# Patient Record
Sex: Female | Born: 1950 | Race: White
Health system: Southern US, Community
[De-identification: ages and names within clinical notes are randomized; demographics above are authoritative.]

## PROBLEM LIST (undated history)

## (undated) DIAGNOSIS — E039 Hypothyroidism, unspecified: Secondary | ICD-10-CM

## (undated) DIAGNOSIS — G2581 Restless legs syndrome: Secondary | ICD-10-CM

## (undated) DIAGNOSIS — E785 Hyperlipidemia, unspecified: Secondary | ICD-10-CM

## (undated) DIAGNOSIS — R55 Syncope and collapse: Secondary | ICD-10-CM

## (undated) DIAGNOSIS — I609 Nontraumatic subarachnoid hemorrhage, unspecified: Secondary | ICD-10-CM

## (undated) DIAGNOSIS — T884XXA Failed or difficult intubation, initial encounter: Secondary | ICD-10-CM

## (undated) DIAGNOSIS — K219 Gastro-esophageal reflux disease without esophagitis: Secondary | ICD-10-CM

## (undated) DIAGNOSIS — Z0000 Encounter for general adult medical examination without abnormal findings: Secondary | ICD-10-CM

## (undated) DIAGNOSIS — F32A Depression, unspecified: Secondary | ICD-10-CM

## (undated) DIAGNOSIS — F419 Anxiety disorder, unspecified: Secondary | ICD-10-CM

## (undated) DIAGNOSIS — J349 Unspecified disorder of nose and nasal sinuses: Secondary | ICD-10-CM

## (undated) DIAGNOSIS — R1310 Dysphagia, unspecified: Secondary | ICD-10-CM

## (undated) DIAGNOSIS — L309 Dermatitis, unspecified: Secondary | ICD-10-CM

## (undated) DIAGNOSIS — K5792 Diverticulitis of intestine, part unspecified, without perforation or abscess without bleeding: Secondary | ICD-10-CM

## (undated) DIAGNOSIS — R262 Difficulty in walking, not elsewhere classified: Secondary | ICD-10-CM

## (undated) DIAGNOSIS — E079 Disorder of thyroid, unspecified: Secondary | ICD-10-CM

## (undated) DIAGNOSIS — M1990 Unspecified osteoarthritis, unspecified site: Secondary | ICD-10-CM

## (undated) DIAGNOSIS — I10 Essential (primary) hypertension: Secondary | ICD-10-CM

## (undated) DIAGNOSIS — H269 Unspecified cataract: Secondary | ICD-10-CM

## (undated) DIAGNOSIS — N390 Urinary tract infection, site not specified: Secondary | ICD-10-CM

## (undated) DIAGNOSIS — J45909 Unspecified asthma, uncomplicated: Secondary | ICD-10-CM

## (undated) DIAGNOSIS — R0609 Other forms of dyspnea: Secondary | ICD-10-CM

## (undated) HISTORY — DX: Syncope and collapse: R55

## (undated) HISTORY — DX: Essential (primary) hypertension: I10

## (undated) HISTORY — DX: Unspecified cataract: H26.9

## (undated) HISTORY — DX: Other forms of dyspnea: R06.09

## (undated) HISTORY — PX: HYSTERECTOMY: SHX81

## (undated) HISTORY — DX: Gastro-esophageal reflux disease without esophagitis: K21.9

## (undated) HISTORY — PX: NASAL SEPTUM SURGERY: SHX37

## (undated) HISTORY — DX: Failed or difficult intubation, initial encounter: T88.4XXA

## (undated) HISTORY — DX: Nontraumatic subarachnoid hemorrhage, unspecified: I60.9

## (undated) HISTORY — DX: Encounter for general adult medical examination without abnormal findings: Z00.00

## (undated) HISTORY — DX: Depression, unspecified: F32.A

## (undated) HISTORY — DX: Unspecified osteoarthritis, unspecified site: M19.90

## (undated) HISTORY — DX: Unspecified disorder of nose and nasal sinuses: J34.9

## (undated) HISTORY — DX: Dysphagia, unspecified: R13.10

## (undated) HISTORY — DX: Unspecified asthma, uncomplicated: J45.909

## (undated) HISTORY — DX: Difficulty in walking, not elsewhere classified: R26.2

## (undated) HISTORY — DX: Hypothyroidism, unspecified: E03.9

## (undated) HISTORY — DX: Restless legs syndrome: G25.81

## (undated) HISTORY — DX: Disorder of thyroid, unspecified: E07.9

## (undated) HISTORY — DX: Diverticulitis of intestine, part unspecified, without perforation or abscess without bleeding: K57.92

## (undated) HISTORY — PX: COLONOSCOPY: SHX174

## (undated) HISTORY — DX: Hyperlipidemia, unspecified: E78.5

## (undated) HISTORY — DX: Anxiety disorder, unspecified: F41.9

## (undated) HISTORY — DX: Dermatitis, unspecified: L30.9

## (undated) HISTORY — DX: Urinary tract infection, site not specified: N39.0

---

## 1996-04-24 ENCOUNTER — Ambulatory Visit: Admit: 1996-04-24 | Disposition: A | Discharge status: Home / Self Care | Payer: Self-pay | Source: Ambulatory Visit

## 2003-02-19 ENCOUNTER — Ambulatory Visit
Admit: 2003-02-19 | Disposition: A | Discharge status: Home / Self Care | Payer: Self-pay | Source: Ambulatory Visit | Admitting: Gynecology

## 2004-03-21 ENCOUNTER — Ambulatory Visit
Admit: 2004-03-21 | Disposition: A | Discharge status: Home / Self Care | Payer: Self-pay | Source: Ambulatory Visit | Admitting: Gynecology

## 2005-02-20 DIAGNOSIS — Z9889 Other specified postprocedural states: Secondary | ICD-10-CM

## 2005-02-20 HISTORY — DX: Other specified postprocedural states: Z98.890

## 2005-10-30 ENCOUNTER — Ambulatory Visit: Admission: RE | Admit: 2005-10-30 | Payer: Self-pay | Source: Ambulatory Visit | Admitting: Otolaryngology

## 2009-03-19 ENCOUNTER — Encounter (INDEPENDENT_AMBULATORY_CARE_PROVIDER_SITE_OTHER): Payer: Self-pay | Admitting: Internal Medicine

## 2010-02-20 DIAGNOSIS — Z0000 Encounter for general adult medical examination without abnormal findings: Secondary | ICD-10-CM

## 2010-02-20 HISTORY — DX: Encounter for general adult medical examination without abnormal findings: Z00.00

## 2010-08-31 ENCOUNTER — Emergency Department
Admit: 2010-08-31 | Disposition: A | Discharge status: Home / Self Care | Payer: Self-pay | Source: Emergency Department | Admitting: Emergency Medicine

## 2010-08-31 LAB — COMPREHENSIVE METABOLIC PANEL
ALT: 21 U/L (ref 0–55)
AST (SGOT): 21 U/L (ref 5–34)
Albumin/Globulin Ratio: 1.1 (ref 0.9–2.2)
Albumin: 3.5 g/dL (ref 3.5–5.0)
Alkaline Phosphatase: 39 U/L — ABNORMAL LOW (ref 40–150)
Anion Gap: 12 (ref 5.0–15.0)
BUN: 33 mg/dL — ABNORMAL HIGH (ref 7.0–19.0)
Bilirubin, Total: 0.3 mg/dL (ref 0.2–1.2)
CO2: 19 mEq/L — ABNORMAL LOW (ref 22–29)
Calcium: 9.8 mg/dL (ref 8.5–10.5)
Chloride: 109 mEq/L — ABNORMAL HIGH (ref 98–107)
Creatinine: 1.2 mg/dL — ABNORMAL HIGH (ref 0.6–1.0)
Globulin: 3.2 g/dL (ref 2.0–3.6)
Glucose: 106 mg/dL — ABNORMAL HIGH (ref 70–100)
Potassium: 3.9 mEq/L (ref 3.5–5.1)
Protein, Total: 6.7 g/dL (ref 6.0–8.3)
Sodium: 140 mEq/L (ref 136–145)

## 2010-08-31 LAB — URINALYSIS, REFLEX TO MICROSCOPIC EXAM IF INDICATED
Bilirubin, UA: NEGATIVE
Glucose, UA: NEGATIVE
Ketones UA: NEGATIVE
Nitrite, UA: NEGATIVE
Protein, UR: NEGATIVE
RBC, UA: 1 /HPF (ref 0–5)
Specific Gravity UA POCT: 1.01 (ref 1.001–1.035)
Urine pH: 6 (ref 5.0–8.0)
Urobilinogen, UA: NORMAL mg/dL

## 2010-08-31 LAB — CBC AND DIFFERENTIAL
Basophils Absolute Automated: 0.02 10*3/uL (ref 0.00–0.20)
Basophils Automated: 0 % (ref 0–2)
Eosinophils Absolute Automated: 0.45 10*3/uL (ref 0.00–0.70)
Eosinophils Automated: 6 % — ABNORMAL HIGH (ref 0–5)
Hematocrit: 37.9 % (ref 37.0–47.0)
Hgb: 12.8 g/dL (ref 12.0–16.0)
Immature Granulocytes Absolute: 0.01 10*3/uL
Immature Granulocytes: 0 % (ref 0–1)
Lymphocytes Absolute Automated: 2.12 10*3/uL (ref 0.50–4.40)
Lymphocytes Automated: 31 % (ref 15–41)
MCH: 28.4 pg (ref 28.0–32.0)
MCHC: 33.8 g/dL (ref 32.0–36.0)
MCV: 84 fL (ref 80.0–100.0)
MPV: 9.4 fL (ref 9.4–12.3)
Monocytes Absolute Automated: 0.51 10*3/uL (ref 0.00–1.20)
Monocytes: 7 % (ref 0–11)
Neutrophils Absolute: 3.83 10*3/uL (ref 1.80–8.10)
Neutrophils: 55 % (ref 52–75)
Nucleated RBC: 0 /100 WBC
Platelets: 302 10*3/uL (ref 140–400)
RBC: 4.51 10*6/uL (ref 4.20–5.40)
RDW: 14 % (ref 12–15)
WBC: 6.93 10*3/uL (ref 3.50–10.80)

## 2010-08-31 LAB — GFR: EGFR: 45.8

## 2010-08-31 LAB — HEMOLYSIS INDEX: Hemolysis Index: 8 Index (ref 0–18)

## 2010-08-31 LAB — LIPASE: Lipase: 89 U/L — ABNORMAL HIGH (ref 8–78)

## 2010-09-16 ENCOUNTER — Ambulatory Visit (INDEPENDENT_AMBULATORY_CARE_PROVIDER_SITE_OTHER): Payer: BLUE CROSS/BLUE SHIELD | Admitting: Internal Medicine

## 2010-09-16 ENCOUNTER — Encounter (INDEPENDENT_AMBULATORY_CARE_PROVIDER_SITE_OTHER): Payer: Self-pay | Admitting: Internal Medicine

## 2010-09-16 DIAGNOSIS — E782 Mixed hyperlipidemia: Secondary | ICD-10-CM

## 2010-09-16 DIAGNOSIS — F32A Depression, unspecified: Secondary | ICD-10-CM | POA: Insufficient documentation

## 2010-09-16 DIAGNOSIS — E785 Hyperlipidemia, unspecified: Secondary | ICD-10-CM | POA: Insufficient documentation

## 2010-09-16 DIAGNOSIS — Z0000 Encounter for general adult medical examination without abnormal findings: Secondary | ICD-10-CM | POA: Insufficient documentation

## 2010-09-16 DIAGNOSIS — R799 Abnormal finding of blood chemistry, unspecified: Secondary | ICD-10-CM

## 2010-09-16 DIAGNOSIS — E079 Disorder of thyroid, unspecified: Secondary | ICD-10-CM | POA: Insufficient documentation

## 2010-09-16 DIAGNOSIS — R109 Unspecified abdominal pain: Secondary | ICD-10-CM

## 2010-09-16 DIAGNOSIS — R7989 Other specified abnormal findings of blood chemistry: Secondary | ICD-10-CM

## 2010-09-16 DIAGNOSIS — B850 Pediculosis due to Pediculus humanus capitis: Secondary | ICD-10-CM

## 2010-09-16 NOTE — Progress Notes (Addendum)
Subjective:       Patient ID: Tracy [name] is a 60 y.o. female.    HPI Comments: 60 yo former pt not seen by me > 2 yrs was in er 7/11 with pain l lateral lowwer abd, In er cbc,ua-nl, bmp nl exc creat 1.2 CT abd/pelvis showed ? Small b/l inguinal  hernias, some scarring R lung, 1 cm fat density adjacent to distal sig,moid of doubtful signif. Says her boweklls and urination nl, no change with flatus,bm, and discomfort is much better and is positional  Says last colonscooy 5 yrs ago dr. Durwin Nora.  alsio says L eye has been red for a month, and she got head lice on trip to [place] 4/12 and still has them-using rid. beytter but still there.    Eye Problem   Pertinent negatives include no fever, nausea or vomiting.     11/01/10 lbas=cmp-nl creat 0.83, alt 41, tsh 5.32  Chol 274 , trigs 225 H53/L176 Notified see me in f/u same meds for now and do cpe recheck tft then she agrees labs faxed dr Neomia Dear   01/05/11 labs-lft-nl, tsh 3.12,Vit D-25 Notified do cpe as told  The following portions of the patient's history were reviewed and updated as appropriate: allergies, current medications, past medical history, past social history, past surgical history and problem list.    Review of Systems   Constitutional: Negative for fever and chills.   Respiratory: Negative for cough.    Gastrointestinal: Negative for nausea, vomiting, diarrhea, constipation, blood in stool, abdominal distention and anal bleeding.   Genitourinary: Negative for dysuria, hematuria and difficulty urinating.           Objective:    Physical Exam   Constitutional: She is oriented to person, place, and time. She appears well-developed and well-nourished. No distress.   Eyes: Left eye exhibits no discharge (l eye diffusely mildly red).   Neck: Normal range of motion. Neck supple. No JVD present. No tracheal deviation present. No thyromegaly present.   Cardiovascular: Normal rate, regular rhythm and normal heart sounds.    Pulmonary/Chest: Effort normal and  breath sounds normal. She has no wheezes. She has no rales.   Abdominal: Soft. Bowel sounds are normal. She exhibits no distension, no abdominal bruit and no mass. There is no hepatosplenomegaly. There is tenderness (minimal tenderness L lateral lower abdomen no masses, guarding). There is no rebound and no guarding.   Lymphadenopathy:     She has no cervical adenopathy.   Neurological: She is alert and oriented to person, place, and time.   Skin: Skin is warm and dry. She is not diaphoretic.   Psychiatric: She has a normal mood and affect.           Assessment:    1probable muscular L lateral abd wall pain started 7/11 much better and ct unrevealing. Advised see her gyn sjhow ct report re b/l ing hernias.  2.creat 1.2  Order given cmp,tsh,lipids next week-i'll call  3,scarring R lung on CT 7/11-never smoked [facility] cx[facility]  4.L eye red for a month-see her ophthalmologist-sje will call dr Karie Chimera today  5.says persistent head lice 3 m-rx for malathion 0.5% lotion-apply dry scalp ,then shampoo 8-12 hrs later  6.get old recordsthen cpe      Plan:    as above

## 2010-10-31 ENCOUNTER — Other Ambulatory Visit (INDEPENDENT_AMBULATORY_CARE_PROVIDER_SITE_OTHER): Payer: Self-pay | Admitting: Internal Medicine

## 2010-11-01 ENCOUNTER — Encounter (INDEPENDENT_AMBULATORY_CARE_PROVIDER_SITE_OTHER): Payer: Self-pay | Admitting: Internal Medicine

## 2010-11-01 LAB — COMPREHENSIVE METABOLIC PANEL
ALT: 41 IU/L — ABNORMAL HIGH (ref 0–40)
AST (SGOT): 26 IU/L (ref 0–40)
Albumin/Globulin Ratio: 1.7 (ref 1.1–2.5)
Albumin: 4.3 g/dL (ref 3.6–4.8)
Alkaline Phosphatase: 55 IU/L (ref 25–165)
BUN / Creatinine Ratio: 18 (ref 11–26)
BUN: 15 mg/dL (ref 8–27)
Bilirubin, Total: 0.3 mg/dL (ref 0.0–1.2)
CO2: 22 mmol/L (ref 20–32)
Calcium: 9.1 mg/dL (ref 8.6–10.2)
Chloride: 104 mmol/L (ref 97–108)
Creatinine: 0.83 mg/dL (ref 0.57–1.00)
EGFR: 77 mL/min/{1.73_m2} (ref >59)
EGFR: 89 mL/min/{1.73_m2} (ref >59)
Globulin, Total: 2.6 g/dL (ref 1.5–4.5)
Glucose: 89 mg/dL (ref 65–99)
Potassium: 4.5 mmol/L (ref 3.5–5.2)
Protein, Total: 6.9 g/dL (ref 6.0–8.5)
Sodium: 140 mmol/L (ref 134–144)

## 2010-11-01 LAB — LIPID PANEL, WITHOUT TOTAL CHOLESTEROL/HDL RATIO, SERUM
Cholesterol: 274 mg/dL — ABNORMAL HIGH (ref 100–199)
HDL: 53 mg/dL (ref >39)
LDL Calculated: 176 mg/dL — ABNORMAL HIGH (ref 0–99)
Triglycerides: 225 mg/dL — ABNORMAL HIGH (ref 0–149)
VLDL Calculated: 45 mg/dL — ABNORMAL HIGH (ref 5–40)

## 2010-11-01 LAB — AMBIG ABBREV LP DEFAULT

## 2010-11-01 LAB — AMBIG ABBREV CMP14 DEFAULT

## 2010-11-01 LAB — TSH: TSH: 5.32 u[IU]/mL — ABNORMAL HIGH (ref 0.450–4.500)

## 2010-11-03 ENCOUNTER — Telehealth (INDEPENDENT_AMBULATORY_CARE_PROVIDER_SITE_OTHER): Payer: Self-pay | Admitting: Internal Medicine

## 2010-11-03 NOTE — Telephone Encounter (Signed)
i called in 10 tabs

## 2010-11-03 NOTE — Telephone Encounter (Signed)
Patient does not have any more pills for her levothyroxine . She has an appoinment scheduled to see you on Monday for her physical she is wondering if you can call in a few pills for her to last until Monday to her [facility] at  [phone]. If you would like to speak with her she can be reached at [phone]

## 2010-11-07 ENCOUNTER — Ambulatory Visit (INDEPENDENT_AMBULATORY_CARE_PROVIDER_SITE_OTHER): Payer: BLUE CROSS/BLUE SHIELD | Admitting: Internal Medicine

## 2010-11-07 ENCOUNTER — Encounter (INDEPENDENT_AMBULATORY_CARE_PROVIDER_SITE_OTHER): Payer: Self-pay | Admitting: Internal Medicine

## 2010-11-07 VITALS — BP 100/67 | HR 66 | Temp 97.8°F | Ht 66.0 in | Wt 150.0 lb

## 2010-11-07 DIAGNOSIS — E039 Hypothyroidism, unspecified: Secondary | ICD-10-CM

## 2010-11-07 DIAGNOSIS — F32A Depression, unspecified: Secondary | ICD-10-CM

## 2010-11-07 DIAGNOSIS — M8580 Other specified disorders of bone density and structure, unspecified site: Secondary | ICD-10-CM

## 2010-11-07 DIAGNOSIS — Z9889 Other specified postprocedural states: Secondary | ICD-10-CM | POA: Insufficient documentation

## 2010-11-07 DIAGNOSIS — Z0000 Encounter for general adult medical examination without abnormal findings: Secondary | ICD-10-CM

## 2010-11-07 DIAGNOSIS — E782 Mixed hyperlipidemia: Secondary | ICD-10-CM

## 2010-11-07 DIAGNOSIS — F3289 Other specified depressive episodes: Secondary | ICD-10-CM

## 2010-11-07 DIAGNOSIS — M949 Disorder of cartilage, unspecified: Secondary | ICD-10-CM

## 2010-11-07 NOTE — Progress Notes (Addendum)
Subjective:       Patient ID: Tracy [name] is a 60 y.o. female.    HPI Comments: to distal sig,moid of doubtful signif. Says her boweklls and urination nl, no change with flatus,bm, and discomfort is much better and is positional  Says last colonscooy 5 yrs ago dr. Durwin Nora.  alsio says L eye has been red for a month, and she got head lice on trip to [place] 4/12 and still has them-using rid. beytter but still there.      Eye Problem    Pertinent negatives include no fever, nausea or vomiting.   11/01/10 lbas=cmp-nl creat 0.83, alt 41, tsh 5.32  Chol 274 , trigs 225 H53/L176 Notified see me in f/u same meds for now and do cpe recheck tft then she agrees labs faxed dr Neomia Dear     11/07/10  60 yo here cpe  Labs as above-says she was out of synthroid for a week before labs drawn.depression-on prozac sees therapist has been stable on this med. 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 Says she has some orthostatic sx wg=hen gets up quickly from leaning over, standing quickly-very brief sx and NO su=yncope. Had myosistis with statins.  06/04/11 labs-cmp-nl creat 0.85, chol 243 trigs 217 H47/L153, ck-205-labs from dr Elisabeth Most ordered 08/11/11 thallium normal-planning abd surgery in [facility]    The following portions of the patient's history were reviewed and updated as appropriate: allergies, current medications, past family history, past medical history, past social history, past surgical history and problem list.    Review of Systems   Constitutional: Negative for fever, chills and unexpected weight change.   Eyes: Negative for visual disturbance.   Respiratory: Negative for cough, shortness of breath and wheezing.    Cardiovascular: Negative for chest pain and palpitations.   Gastrointestinal: Negative for nausea, vomiting, abdominal pain, diarrhea, constipation, blood in stool, abdominal distention, anal bleeding and rectal pain.    Genitourinary: Negative for hematuria, difficulty urinating and pelvic pain.   Musculoskeletal: Negative for myalgias.   Neurological: Positive for light-headedness. Negative for dizziness, seizures, syncope, weakness, numbness and headaches.           Objective:    Physical Exam   Constitutional: She is oriented to person, place, and time. She appears well-developed and well-nourished. No distress.        BP by me left arm 98/66, right 94/66   HENT:   Right Ear: External ear normal.   Left Ear: External ear normal.   Mouth/Throat: Oropharynx is clear and moist. No oropharyngeal exudate.        tms nl   Eyes: EOM are normal. Pupils are equal, round, and reactive to light.   Neck: Normal range of motion. No JVD present. No tracheal deviation present. No thyromegaly present.        Carotids 2+ no bruits   Cardiovascular: Normal rate, regular rhythm, normal heart sounds and intact distal pulses.  Exam reveals no gallop and no friction rub.    No murmur heard.  Pulmonary/Chest: Effort normal and breath sounds normal. No respiratory distress. She has no wheezes. She has no rales.   Abdominal: Soft. Bowel sounds are normal. She exhibits no distension, no abdominal bruit and no mass. There is no hepatosplenomegaly. There is no tenderness. There is no rebound and no guarding.   Musculoskeletal: She exhibits no edema.   Lymphadenopathy:  Head (right side): No submental, no submandibular and no occipital adenopathy present.        Head (left side): No submental, no submandibular and no occipital adenopathy present.     She has no cervical adenopathy.        Right cervical: No superficial cervical, no deep cervical and no posterior cervical adenopathy present.       Left cervical: No superficial cervical, no deep cervical and no posterior cervical adenopathy present.        Right: No supraclavicular adenopathy present.        Left: No supraclavicular adenopathy present.   Neurological: She is alert and oriented to  person, place, and time. She has normal reflexes. No cranial nerve deficit.   Skin: Skin is warm and dry. She is not diaphoretic.   Psychiatric: She has a normal mood and affect.           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  2. Describes sx of simple brief orthostatitc hypotension-NO syncope her BP is low-discuss with dr obre=ien but i explained this is physiologic, get up slowly,keeop hydrated  3. MGM colon cancer 70-I'll check with dr Cecil Cobbs says colonscopy 2007-? Due f/u has noGI sx  4.GYN-sees gun 1/13 pap/mammo-alsosays was getting rclast-dexa with gyn 2011 o'penia-disci=uss ALL this with gyn 1/13  Was on ergocalciferol-check Vit d with labs 11/12  5.hypothyroid-tsh sl elvated was out of med for a week resume synthroid 70mcg/d and recheck TSH,Vit D and LFT 1st week 11/12-has ast 41  6 depression-sees therapist,says stable on propzac 20mg   i rnewd this 90 ref x1  7. Had pneumovax age 33-get #2 age 51. Gets flu shot at work. Had tetanus shot,MMR,hep A and B 3/12 for travel and says she goes back next month for hep bpooosters. I gave order [facility].      Plan:    as above do tsh'lft/Vit D 11/12 i'll call. Note faxed dr Neomia Dear and i'll call dr Durwin Nora re ? Due colonscopy. Same meds for now      Nopte i checked with dr barkin-colonscopy 1/08-is due 5 yr f/iu  1/13 pt notified

## 2010-11-14 ENCOUNTER — Encounter (INDEPENDENT_AMBULATORY_CARE_PROVIDER_SITE_OTHER): Payer: Self-pay

## 2010-11-18 ENCOUNTER — Encounter (INDEPENDENT_AMBULATORY_CARE_PROVIDER_SITE_OTHER): Payer: Self-pay | Admitting: Internal Medicine

## 2010-12-08 NOTE — Op Note (Unsigned)
[name], Tracy [name]      MEDICAL RECORD NUMBER:         [id number]      PATIENT LOCATION/SERVICE:       [id number]            DATE OF SURGERY:               10/30/2005      SURGEON:                       Domenick Gong, MD      ASSISTANT 1:            PREOPERATIVE DIAGNOSES      1.   Deviated nasal septum.      2.   Concha bullosa and nasal polyps.            POSTOPERATIVE DIAGNOSES      1.   Deviated nasal septum.      2.   Concha bullosa and nasal polyps.            PROCEDURES      1.   Septoplasty.      2.   Excision of concha bullosa bilaterally.      3.   Polypectomy.            ANESTHESIA:  General.            DESCRIPTION OF PROCEDURE:  The patient was taken to the operating room and      placed in supine position.  General anesthesia was administered via the      orotracheal tube.  Endotracheal intubation was uneventful.  The face and      nose were prepped with Betadine solution and the patient was draped in the      usual sterile fashion.            Cotton pledgets saturated in cocaine solution were placed in both nasal      passages to achieve topical anesthesia and vasoconstriction.  The inferior      turbinates were very hypertrophic and she had bilateral polypoid concha      bullosa.  Septum was deviated to the right side with a large spur on the      right consisting of both bone and curettage.  Then, nasal hairs were      trimmed and 0.5% lidocaine with 1:90,000 Adrenalin was used to infiltrate      nasal membranes to achieve local anesthesia and vasoconstriction.            Then, the septoplasty was commenced.  Bilateral Killian incisions were      performed and the sharp curved Peck scissors were used to identify the      plane between the mucoperichondrium and septal cartilage and then between      the mucoperiosteum and maxillary crest.  Mucoperichondrial and      mucoperiosteal flaps were elevated using Cottle elevator and they were      joined.  Then, septal button  knife was used to make an incision in the      septal cartilage above the spur and the deviated portion of the septal      cartilage was removed with Lenoria Chime forceps.  Then the mucoperiosteal      flaps were elevated from the maxillary crest  and vomer and a portion of the      perpendicular plate of the ethmoid and the deviated portions of it were      removed with double-action bony rongeurs as conservative as possible.      There were tears along the floor of the nose on the right side.      Mucoperiosteal flaps were then approximated with 4-0 plain running mattress      suture and the Killian incisions closed with 5-0 plain catgut.  Silastic      sheeting was applied to both sides of the nasal septum and secured in place      with 4-0 plain catgut.            Then, attention was turned to the concha bullosa and nasal polyps.  The      nasal endoscope was used to visualization the middle turbinates (concha      bullosa) and polyps.  Turbinate microscissors were used to bisect the      concha bullosa and lateral portions of the concha bullosa were removed with      Blakesley forceps.  The nasal polyps which originated from the middle      turbinates were also removed with Blakesley forceps.  An identical      procedure was performed on both sides.  Then, the sinus packing was placed      on the lateral aspect of each remaining portion of the middle turbinates      consisting of a Merocel sponge.  Then, nasal packing was placed on both      sides of the septum consisting of Telfa gauze.  The patient tolerated the      procedure well and was taken to the recovery room in satisfactory      condition.                                          ___________________________________     Date Signed: _______________      Domenick Gong, MD                  V [id number]      D:    10/30/2005      T:    10/30/2005      #:    [id number]      N:    [id number]            cc:   Domenick Gong, MD

## 2011-01-03 ENCOUNTER — Other Ambulatory Visit (INDEPENDENT_AMBULATORY_CARE_PROVIDER_SITE_OTHER): Payer: Self-pay | Admitting: Internal Medicine

## 2011-01-04 LAB — HEPATIC FUNCTION PANEL
ALT: 15 IU/L (ref 0–32)
AST (SGOT): 20 IU/L (ref 0–40)
Albumin: 4.2 g/dL (ref 3.6–4.8)
Alkaline Phosphatase: 37 IU/L (ref 25–165)
Bilirubin Direct: 0.07 mg/dL (ref 0.00–0.40)
Bilirubin, Total: 0.2 mg/dL (ref 0.0–1.2)
Protein, Total: 6.8 g/dL (ref 6.0–8.5)

## 2011-01-04 LAB — AMBIG ABBREV HFP7 DEFAULT

## 2011-01-04 LAB — VITAMIN D, 25-HYDROXY, SCREENING TEST, WITHOUT D2 AND D3: Vitamin D 25-Hydroxy: 25.5 ng/mL — ABNORMAL LOW (ref 30.0–100.0)

## 2011-01-04 LAB — TSH: TSH: 3.12 u[IU]/mL (ref 0.450–4.500)

## 2011-03-01 ENCOUNTER — Encounter (INDEPENDENT_AMBULATORY_CARE_PROVIDER_SITE_OTHER): Payer: Self-pay

## 2011-05-30 ENCOUNTER — Encounter (INDEPENDENT_AMBULATORY_CARE_PROVIDER_SITE_OTHER): Payer: Self-pay

## 2011-05-30 ENCOUNTER — Encounter (INDEPENDENT_AMBULATORY_CARE_PROVIDER_SITE_OTHER): Payer: Self-pay | Admitting: Internal Medicine

## 2011-06-15 ENCOUNTER — Encounter (INDEPENDENT_AMBULATORY_CARE_PROVIDER_SITE_OTHER): Payer: Self-pay

## 2011-06-21 ENCOUNTER — Encounter (INDEPENDENT_AMBULATORY_CARE_PROVIDER_SITE_OTHER): Payer: Self-pay

## 2011-08-11 ENCOUNTER — Encounter (INDEPENDENT_AMBULATORY_CARE_PROVIDER_SITE_OTHER): Payer: Self-pay

## 2011-08-14 ENCOUNTER — Encounter (INDEPENDENT_AMBULATORY_CARE_PROVIDER_SITE_OTHER): Payer: Self-pay

## 2011-08-18 ENCOUNTER — Other Ambulatory Visit (INDEPENDENT_AMBULATORY_CARE_PROVIDER_SITE_OTHER): Payer: Self-pay | Admitting: Internal Medicine

## 2011-09-11 ENCOUNTER — Encounter (INDEPENDENT_AMBULATORY_CARE_PROVIDER_SITE_OTHER): Payer: Self-pay

## 2011-09-11 ENCOUNTER — Other Ambulatory Visit (INDEPENDENT_AMBULATORY_CARE_PROVIDER_SITE_OTHER): Payer: Self-pay | Admitting: Internal Medicine

## 2011-09-11 NOTE — Progress Notes (Signed)
Called in levothyroxine 30 tabs 0 refills to [facility] [place].

## 2011-10-18 ENCOUNTER — Other Ambulatory Visit (INDEPENDENT_AMBULATORY_CARE_PROVIDER_SITE_OTHER): Payer: Self-pay | Admitting: Internal Medicine

## 2011-11-06 ENCOUNTER — Ambulatory Visit (INDEPENDENT_AMBULATORY_CARE_PROVIDER_SITE_OTHER): Payer: BLUE CROSS/BLUE SHIELD | Admitting: Internal Medicine

## 2011-11-06 ENCOUNTER — Encounter (INDEPENDENT_AMBULATORY_CARE_PROVIDER_SITE_OTHER): Payer: Self-pay | Admitting: Internal Medicine

## 2011-11-06 VITALS — BP 101/65 | HR 71 | Temp 98.8°F | Ht 66.0 in | Wt 145.0 lb

## 2011-11-06 DIAGNOSIS — M8580 Other specified disorders of bone density and structure, unspecified site: Secondary | ICD-10-CM

## 2011-11-06 DIAGNOSIS — Z0000 Encounter for general adult medical examination without abnormal findings: Secondary | ICD-10-CM

## 2011-11-06 DIAGNOSIS — F32A Depression, unspecified: Secondary | ICD-10-CM

## 2011-11-06 DIAGNOSIS — E782 Mixed hyperlipidemia: Secondary | ICD-10-CM

## 2011-11-06 DIAGNOSIS — Z79899 Other long term (current) drug therapy: Secondary | ICD-10-CM

## 2011-11-06 DIAGNOSIS — E039 Hypothyroidism, unspecified: Secondary | ICD-10-CM

## 2011-11-06 DIAGNOSIS — M949 Disorder of cartilage, unspecified: Secondary | ICD-10-CM

## 2011-11-06 DIAGNOSIS — F3289 Other specified depressive episodes: Secondary | ICD-10-CM

## 2011-11-06 NOTE — Progress Notes (Addendum)
Subjective:       Patient ID: Tracy [name] is a 61 y.o. female.    HPI Comments:     Eye Problem     Pertinent negatives include no fever, nausea or vomiting.    11/01/10 lbas=cmp-nl creat 0.83, alt 41, tsh 5.32  Chol 274 , trigs 225 H53/L176 Notified see me in f/u same meds for now and do cpe recheck tft then she agrees labs faxed dr Neomia Dear     11/07/10  60 yo here cpe  Labs as above-says she was out of synthroid for a week before labs drawn.depression-on prozac sees therapist has been stable on this med. 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 Says she has some orthostatic sx wg=hen gets up quickly from leaning over, standing quickly-very brief sx and NO su=yncope. Had myosistis with statins.  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  Nl CXR 4/13 Had teteanus booster 3/12. Depression-followed by psychiatry  Labs 4/13- cmp-nl creat 0.85, chol 243 trigs 217 H47/L153,cpk=205  Had laparscopic complete hystterectomy,repai cystocele/rectocele 09/15/11 in [place]-sees her gyn in 2 days-still some pain-working part-time now-has issues with urinary incontinence and still some mild bloating. Was due 5 yr colonsocpy 1/13-put this off for surgery-needs to see dr barkin-bowels nl, no thin/bloody stools.No fevers-feels tired and occasionally light-headed. Is overdue to check tsh on synthroid  Depression stable-on prozac per psychiatrist. Is due mammop/dexa 1/14 with her gyn-no longer onReclast,ergocalciferol-these were rxd by her gyn    11/08/11 labs cbc-nl, cmp-nl creat 1.08,randome  glucose 108,tsh 4.71 Notified-I mailed order repeat TSH/FBS/A1c in 1 month-same sythroid for now I mailed her note/labs for other docs.    The following portions of the patient's history were reviewed and updated as appropriate: allergies, current medications, past family history, past medical history, past social history, past surgical history and problem list.    Review of Systems   Constitutional: Negative for fever, chills and unexpected weight change.   Eyes: Negative for visual disturbance.   Respiratory: Negative for cough, shortness of breath and wheezing.    Cardiovascular: Negative for chest pain and palpitations.   Gastrointestinal: Positive for abdominal pain. Negative for nausea, vomiting, diarrhea, constipation, blood in stool, abdominal distention, anal bleeding and rectal pain.   Genitourinary: Positive for difficulty urinating. Negative for hematuria.   Musculoskeletal: Negative for myalgias.   Skin: Negative for color change.   Neurological: Negative for seizures, syncope, numbness and headaches.   Psychiatric/Behavioral: Negative for dysphoric mood.           Objective:    Physical Exam   Constitutional: She is oriented to person, place, and time. She appears well-developed and well-nourished. No distress.   HENT:   Right Ear: External ear normal.   Left Ear: External ear normal.   Mouth/Throat: Oropharynx is clear and moist. No oropharyngeal exudate.        tms nl   Eyes: EOM are normal. Pupils are equal, round, and reactive to light.   Neck: Normal range of motion. Neck supple. No JVD present. No tracheal deviation present.  No thyromegaly present.        Carotids 2+ no bruits   Cardiovascular: Normal rate, regular rhythm, normal heart sounds and intact distal pulses.  Exam reveals no gallop and no friction rub.    No murmur heard.  Pulmonary/Chest: Effort normal and breath sounds normal. No respiratory distress. She has no wheezes. She has no rales.   Abdominal: Soft. Bowel sounds are normal.  She exhibits no distension, no abdominal bruit, no pulsatile midline mass and no mass. There is no hepatosplenomegaly. There is no tenderness. There is no rebound and no guarding.   Musculoskeletal: She exhibits no edema.   Lymphadenopathy:        Head (right side): No submental, no submandibular and no occipital adenopathy present.        Head (left side): No submental, no submandibular and no occipital adenopathy present.     She has no cervical adenopathy.        Right cervical: No superficial cervical, no deep cervical and no posterior cervical adenopathy present.       Left cervical: No superficial cervical, no deep cervical and no posterior cervical adenopathy present.        Right: No supraclavicular adenopathy present.        Left: No supraclavicular adenopathy present.   Neurological: She is alert and oriented to person, place, and time. No cranial nerve deficit.   Skin: Skin is warm and dry. She is not diaphoretic.        Benign freckles   Psychiatric: She has a normal mood and affect.           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 2. Describes sx of simple brief orthostatitc hypotension-NO syncope her BP is low-discuss with dr obre=ien but i explained this is physiologic, get up slowly,keeop hydrated 3. MGM colon cancer 70-I'll check with dr Cecil Cobbs says colonscopy 2007-? 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  Had pneumovax age 57-get #2 age 25. Gets flu shot at work. Had tetanus shot,MMR,hep A and B 3/12 for travel and says she goes back next month for hep bpooosters. I gave order [facility] at [facility].     Plan:    as above do tsh'lft/Vit D 11/12 i'll call. Note faxed dr Neomia Dear and i'll call dr Durwin Nora re ? Due colonscopy. Same meds for now   Nopte i checked with dr barkin-colonscopy 1/08-is due 5 yr f/iu 1/13 pt notified       11/06/11  61 yo not seen 12 m here cpe -has lap hytserectomy/rectocele-cystocele repair 09/15/11 in [place] and see her gyn in 2 days-some urinary inccontinence and still some pain,fatigue,mild bloating. CVheck cbc,cmp today    1. 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

## 2011-11-07 LAB — CBC AND DIFFERENTIAL
Atypical Lymphocytes %: 0 %
Baso(Absolute): 37 cells/uL (ref 0–200)
Basophils: 0.5 %
Eosinophils Absolute: 266 cells/uL (ref 15–500)
Eosinophils: 3.6 %
Hematocrit: 39.3 % (ref 35.0–45.0)
Hemoglobin: 13.1 g/dL (ref 11.7–15.5)
Lymphocytes Absolute: 2183 cells/uL (ref 850–3900)
Lymphocytes: 29.5 %
MCH: 28.8 pg (ref 27–33)
MCHC: 33.2 g/dL (ref 32–36)
MCV: 87 fL (ref 80–100)
MPV: 7.9 fL (ref 7.5–11.5)
Monocytes Absolute: 348 cells/uL (ref 200–950)
Monocytes: 4.7 %
Neutrophils Absolute: 4566 cells/uL (ref 1500–7800)
Neutrophils: 61.7 %
Platelets: 338 10*3/uL (ref 140–400)
RBC: 4.53 10*6/uL (ref 3.80–5.10)
RDW: 15 % (ref 11.0–15.0)
WBC: 7.4 10*3/uL (ref 3.8–10.8)

## 2011-11-07 LAB — COMPREHENSIVE METABOLIC PANEL
ALT: 24 U/L (ref 6–29)
AST (SGOT): 25 U/L (ref 10–35)
Albumin/Globulin Ratio: 1.7 (ref 1.0–2.5)
Albumin: 4.3 G/DL (ref 3.6–5.1)
Alkaline Phosphatase: 35 U/L (ref 33–130)
BUN / Creatinine Ratio: 19.4 (ref 6–22)
BUN: 21 MG/DL (ref 7–25)
Bilirubin, Total: 0.3 MG/DL (ref 0.2–1.2)
CO2: 21 mmol/L (ref 19–30)
Calcium: 9.1 MG/DL (ref 8.6–10.4)
Chloride: 109 mmol/L (ref 98–110)
Creatinine: 1.08 mg/dL — ABNORMAL HIGH (ref 0.50–0.99)
EGFR African American: 64 mL/min/{1.73_m2} (ref >=60)
EGFR: 55 mL/min/{1.73_m2} — AB (ref >=60)
Globulin: 2.6 G/DL (ref 1.9–3.7)
Glucose: 108 MG/DL — ABNORMAL HIGH (ref 65–99)
Potassium: 4.2 mmol/L (ref 3.5–5.3)
Protein, Total: 6.9 G/DL (ref 6.1–8.1)
Sodium: 145 mmol/L (ref 135–146)

## 2011-11-07 LAB — TSH: TSH: 4.71 mIU/L — ABNORMAL HIGH (ref 0.40–4.50)

## 2011-11-10 ENCOUNTER — Encounter (INDEPENDENT_AMBULATORY_CARE_PROVIDER_SITE_OTHER): Payer: Self-pay

## 2011-11-12 ENCOUNTER — Encounter (INDEPENDENT_AMBULATORY_CARE_PROVIDER_SITE_OTHER): Payer: Self-pay | Admitting: Internal Medicine

## 2011-11-13 ENCOUNTER — Ambulatory Visit (INDEPENDENT_AMBULATORY_CARE_PROVIDER_SITE_OTHER): Payer: BLUE CROSS/BLUE SHIELD

## 2011-11-20 ENCOUNTER — Other Ambulatory Visit (INDEPENDENT_AMBULATORY_CARE_PROVIDER_SITE_OTHER): Payer: Self-pay | Admitting: Internal Medicine

## 2011-12-26 ENCOUNTER — Other Ambulatory Visit (INDEPENDENT_AMBULATORY_CARE_PROVIDER_SITE_OTHER): Payer: Self-pay

## 2011-12-27 MED ORDER — LEVOTHYROXINE SODIUM 50 MCG PO TABS
50.00 ug | ORAL_TABLET | Freq: Every day | ORAL | Status: DC
Start: 2011-12-26 — End: 2012-01-10

## 2011-12-28 ENCOUNTER — Encounter (INDEPENDENT_AMBULATORY_CARE_PROVIDER_SITE_OTHER): Payer: Self-pay | Admitting: Internal Medicine

## 2012-01-03 ENCOUNTER — Encounter (INDEPENDENT_AMBULATORY_CARE_PROVIDER_SITE_OTHER): Payer: Self-pay

## 2012-01-09 ENCOUNTER — Ambulatory Visit (INDEPENDENT_AMBULATORY_CARE_PROVIDER_SITE_OTHER): Payer: BLUE CROSS/BLUE SHIELD | Admitting: Internal Medicine

## 2012-01-09 ENCOUNTER — Other Ambulatory Visit (INDEPENDENT_AMBULATORY_CARE_PROVIDER_SITE_OTHER): Payer: Self-pay | Admitting: Internal Medicine

## 2012-01-09 ENCOUNTER — Encounter (INDEPENDENT_AMBULATORY_CARE_PROVIDER_SITE_OTHER): Payer: Self-pay | Admitting: Internal Medicine

## 2012-01-09 VITALS — BP 122/66 | HR 64 | Temp 97.6°F | Ht 66.0 in | Wt 150.0 lb

## 2012-01-09 DIAGNOSIS — R7309 Other abnormal glucose: Secondary | ICD-10-CM

## 2012-01-09 DIAGNOSIS — E039 Hypothyroidism, unspecified: Secondary | ICD-10-CM

## 2012-01-09 NOTE — Progress Notes (Addendum)
Subjective:       Patient ID: Tracy [name] is a 61 y.o. female.    HPI Comments: Eye Problem      Pertinent negatives include no fever, nausea or vomiting.     11/01/10 lbas=cmp-nl creat 0.83, alt 41, tsh 5.32  Chol 274 , trigs 225 H53/L176 Notified see me in f/u same meds for now and do cpe recheck tft then she agrees labs faxed dr Neomia Dear     11/07/10  61 yo here cpe  Labs as above-says she was out of synthroid for a week before labs drawn.depression-on prozac sees therapist has been stable on this med. 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 Says she has some orthostatic sx wg=hen gets up quickly from leaning over, standing quickly-very brief sx and NO su=yncope. Had myosistis with statins.  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  Nl CXR 4/13 Had teteanus booster 3/12. Depression-followed by psychiatry  Labs 4/13- cmp-nl creat 0.85, chol 243 trigs 217 H47/L153,cpk=205  Had laparscopic complete hystterectomy,repai cystocele/rectocele 09/15/11 in [place]-sees her gyn in 2 days-still some pain-working part-time now-has issues with urinary incontinence and still some mild bloating. Was due 5 yr colonsocpy 1/13-put this off for surgery-needs to see dr barkin-bowels nl, no thin/bloody stools.No fevers-feels tired and occasionally light-headed. Is overdue to check tsh on synthroid  Depression stable-on prozac per psychiatrist. Is due mammop/dexa 1/14 with her gyn-no longer onReclast,ergocalciferol-these were rxd by her gyn  11/08/11 labs cbc-nl, cmp-nl creat 1.08,randome  glucose 108,tsh 4.71 Notified-I mailed order repeat TSH/FBS/A1c in 1 month-same sythroid for now I mailed her note/labs for other docs.      01/09/12  61 yo f/u-needs recheck tsh,FT3/4 and A1c as noted-saw dr Keane Scrape held lipid rx-inc cpk-had stable carotid duplex. Had flu shot at work stillsome fatigue and gyn issues-hasnt seen gi yet    01/10/12 labs tsh-6.96, FT-4-1.07, FT3-,A1c 6.0 I called lm inc synthroid to 85mcg/ q am and cal me 2 m re recheck tsh [facility] Note faxed dr Neomia Dear    The following portions of the patient's history were reviewed and updated as appropriate: allergies, current medications, past medical history, past social history, past surgical history and problem list.    Review of Systems        Objective:    Physical Exam   Constitutional: She is oriented to person, place, and time. She appears well-developed and well-nourished. No distress.   Neck: Normal range of motion. Neck supple. No JVD present. No tracheal deviation present. No thyromegaly present.   Cardiovascular: Normal rate, regular rhythm and normal heart sounds.  Exam reveals no gallop and no friction rub.    No murmur heard.  Pulmonary/Chest: Effort normal and breath sounds normal. No respiratory distress. She has no wheezes. She has no rales.   Abdominal: Soft. Bowel sounds are normal. She exhibits no distension and no mass. There is no tenderness. There is no rebound and no guarding.   Lymphadenopathy:     She has no cervical adenopathy.   Neurological: She  is alert and oriented to person, place, and time. No cranial nerve deficit.   Skin: Skin is warm and dry. She is not diaphoretic.   Psychiatric: She has a normal mood and affect.           Assessment:      Assessment:    11/07/10 61 yo cpe-generally well-mult issues 1.Inc Lipids had myositis with stratins-lipds up on zetia sees dr Neomia Dear in 2 weeks Had thallium and carotid duplex 1/12 she says nl note faxed dr Jonne Ply watch diet. 2. Describes sx of simple brief  orthostatitc hypotension-NO syncope her BP is low-discuss with dr obre=ien but i explained this is physiologic, get up slowly,keeop hydrated 3. MGM colon cancer 70-I'll check with dr Cecil Cobbs says colonscopy 2007-? Due f/u has noGI sx 4.GYN-sees gun 1/13 pap/mammo-alsosays was getting rclast-dexa with gyn 2011 o'penia-disci=uss ALL this with gyn 1/13 Was on ergocalciferol-check Vit d with labs 11/12 5.hypothyroid-tsh sl elvated was out of med for a week resume synthroid 49mcg/d and recheck TSH,Vit D and LFT 1st week 11/12-has ast 41 6 depression-sees therapist,says stable on propzac 20mg  i rnewd this 90 ref x1 7. Had pneumovax age 37-get #2 age 80. Gets flu shot at work. Had tetanus shot,MMR,hep A and B 3/12 for travel and says she goes back next month for hep bpooosters. I gave order zostavax at [facility].   Plan:    as above do tsh'lft/Vit D 11/12 i'll call. Note faxed dr Neomia Dear and i'll call dr Durwin Nora re ? Due colonscopy. Same meds for now   Nopte i checked with dr barkin-colonscopy 1/08-is due 5 yr f/iu 1/13 pt notified     11/06/11 61 yo not seen 12 m here cpe -has lap hytserectomy/rectocele-cystocele repair 09/15/11 in [place] and see her gyn in 2 days-some urinary inccontinence and still some pain,fatigue,mild bloating. CVheck cbc,cmp today 1. 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 per her gyn-dexa 1/12-off this now-get order for dexa for 1/14 from gyn-they follow this 6.hypothyroid on synthroid -check tsh today 6.colonsocpy 1/08 dr Durwin Nora was told 5 yr f/u-MGM colon cancer age 51-hasnt done-had surgery-see him I'll send her note and labs to bring     Plan:    as above I'll call labs-- [phone]-and send  her note/labs for gyn/GI/cards        01/09/12 61 yo f/u .hypothyroid-tsh was 4.71 on synthroid 76mcg/d  Check tsh,FT3/4 probaly needs higher doe-may help with lipids  2.had fbs 107-was 87 with dr obreien-cgheck a1c  3,.increased lipids/cpk-all excellent care with dr Neomia Dear-  Had flu shot and will see GI re colonscopy once gyn issues better       Plan:

## 2012-01-10 ENCOUNTER — Encounter (INDEPENDENT_AMBULATORY_CARE_PROVIDER_SITE_OTHER): Payer: Self-pay

## 2012-01-10 LAB — TSH: TSH: 6.96 u[IU]/mL — ABNORMAL HIGH (ref 0.450–4.500)

## 2012-01-10 LAB — T3, FREE: T3, Free: 2.4 pg/mL (ref 2.0–4.4)

## 2012-01-10 LAB — HEMOGLOBIN A1C: Hemoglobin A1C: 6 % — ABNORMAL HIGH (ref 4.8–5.6)

## 2012-01-10 LAB — T4, FREE: T4, Free: 1.07 ng/dL (ref 0.82–1.77)

## 2012-01-10 MED ORDER — LEVOTHYROXINE SODIUM 75 MCG PO TABS
75.00 ug | ORAL_TABLET | Freq: Every day | ORAL | Status: AC
Start: 2012-01-10 — End: 2013-01-09

## 2012-01-10 NOTE — Addendum Note (Signed)
Addended by: Rodrigo Ran on: 01/10/2012 01:02 PM     Modules accepted: Orders

## 2012-04-11 ENCOUNTER — Encounter (INDEPENDENT_AMBULATORY_CARE_PROVIDER_SITE_OTHER): Payer: Self-pay

## 2012-07-06 LAB — HEMOGLOBIN A1C: Hemoglobin A1C: 6 % — ABNORMAL HIGH (ref 4.8–5.6)

## 2012-07-06 LAB — T4, FREE: T4, Free: 1.25 ng/dL (ref 0.82–1.77)

## 2012-07-06 LAB — TSH: TSH: 3.7 u[IU]/mL (ref 0.450–4.500)

## 2012-07-06 LAB — T3, FREE: T3, Free: 2.5 pg/mL (ref 2.0–4.4)

## 2012-07-08 ENCOUNTER — Encounter (INDEPENDENT_AMBULATORY_CARE_PROVIDER_SITE_OTHER): Payer: Self-pay

## 2012-07-08 NOTE — Progress Notes (Signed)
Spoke to patient about lab results.

## 2012-07-10 ENCOUNTER — Encounter (INDEPENDENT_AMBULATORY_CARE_PROVIDER_SITE_OTHER): Payer: Self-pay

## 2012-07-17 ENCOUNTER — Encounter (INDEPENDENT_AMBULATORY_CARE_PROVIDER_SITE_OTHER): Payer: Self-pay | Admitting: Internal Medicine

## 2012-09-11 ENCOUNTER — Encounter (INDEPENDENT_AMBULATORY_CARE_PROVIDER_SITE_OTHER): Payer: Self-pay

## 2012-10-30 ENCOUNTER — Encounter (INDEPENDENT_AMBULATORY_CARE_PROVIDER_SITE_OTHER): Payer: Self-pay | Admitting: Internal Medicine

## 2012-11-05 ENCOUNTER — Encounter (INDEPENDENT_AMBULATORY_CARE_PROVIDER_SITE_OTHER): Payer: Self-pay

## 2016-02-23 NOTE — Patient Instructions (Addendum)
Exam-specific instructions:     Medication and NPO instructions: Pt does not need to fast and can take her medications as she normally does.    Clothing instructions: Instructed patient to wear loose, comfortable clothing.      Arrival instructions: Instructed patient to arrive at 0800 via the Patient Entrance.  Instructed patient where the CV[facility] is located and about parking options.      Other reminders: Instructed to bring something to keep occupied in case of any unforeseen delays.

## 2016-02-25 ENCOUNTER — Encounter
Admission: RE | Disposition: A | Discharge status: Home / Self Care | Payer: Self-pay | Source: Ambulatory Visit | Attending: Vascular & Interventional Radiology

## 2016-02-25 ENCOUNTER — Ambulatory Visit
Admission: RE | Admit: 2016-02-25 | Discharge: 2016-02-25 | Disposition: A | Discharge status: Home / Self Care | Payer: BLUE CROSS/BLUE SHIELD | Source: Ambulatory Visit | Attending: Vascular & Interventional Radiology | Admitting: Vascular & Interventional Radiology

## 2016-02-25 ENCOUNTER — Ambulatory Visit: Payer: BLUE CROSS/BLUE SHIELD | Admitting: Vascular & Interventional Radiology

## 2016-02-25 DIAGNOSIS — M25552 Pain in left hip: Secondary | ICD-10-CM | POA: Insufficient documentation

## 2016-02-25 DIAGNOSIS — M1612 Unilateral primary osteoarthritis, left hip: Secondary | ICD-10-CM | POA: Insufficient documentation

## 2016-02-25 SURGERY — HIP INJECTION
Laterality: Left

## 2016-02-25 MED ORDER — LIDOCAINE HCL (PF) 1 % IJ SOLN
INTRAMUSCULAR | Status: AC
Start: 2016-02-25 — End: 2016-02-25
  Administered 2016-02-25: 7 mL via INTRADERMAL
  Filled 2016-02-25: qty 30

## 2016-02-25 MED ORDER — METHYLPREDNISOLONE ACETATE 40 MG/ML IJ SUSP
INTRAMUSCULAR | Status: AC
Start: 2016-02-25 — End: 2016-02-25
  Administered 2016-02-25: 80 mg via INTRA_ARTICULAR
  Filled 2016-02-25: qty 2

## 2016-02-25 MED ORDER — BUPIVACAINE HCL (PF) 0.5 % IJ SOLN
INTRAMUSCULAR | Status: AC
Start: 2016-02-25 — End: 2016-02-25
  Administered 2016-02-25: 3 mL via INTRA_ARTICULAR
  Filled 2016-02-25: qty 30

## 2016-02-25 NOTE — Sedation Documentation (Signed)
S: s/p: left hip injection with 80 mg Depomedrol and 3ml of 0.5% Marcaine. VSS. Pt tolerated procedure well.

## 2016-02-25 NOTE — Discharge Instructions (Signed)
[address]  [place], [place] [address]  (phone) [phone]   (fax) [phone]        Hip Injection Discharge Instructions      POST PROCEDURE:  ? You may experience pain or soreness at the injection site.  ? You may also experience mild numbness, tingling or weakness in your leg after your injection. If this occurs, it should resolve within a few hours of injection  ?  Apply ice packs to site for no longer than 20minutes at a time as needed for any discomfort.      ACTIVITY:  ? You may not experience improvement or relief in your symptoms following the procedure, but rather gradually over the next few days to weeks.    ? Most patients feel benefits within 2-4 days  ? Please allow up to 10days for the steroid to start working.    ? Restrict yourself to light activity for 24 hours following your procedure  ? Remove the bandage over the site the morning after your procedure  ? You may shower, however do not submerge in water such as tub bathing or swimming for 24 hours after procedure.   ? Avoid applying lotions or ointments to injection site for 1 week    DIET and MEDICATIONS:  ? You may resume your regular diet and all medications unless otherwise directed  ?  If you are diabetic and received a steroid injection, the steroids may temporarily increase your blood sugar levels. Adjust your medications as directed or notify your primary care physician     CALL [facility] IF:  ? You develop significant discomfort, redness or heat where injection was done   ? New numbness or weakness in your legs within 1 week of procedure  ? You develop a temperature >101 within 48 hours of procedure      We are open from  the hours of 7 am to 6 pm Monday through Friday. If you attempt to call us after hours with a question or concern and are unable to get a hold of us, or you feel the problem may be life threatening  call 911 and go to the closest emergency room   I have received and understand these discharge instructions. I  have no further questions regarding these instructions .     Patient or Representative     __________________________________________________

## 2016-02-25 NOTE — Progress Notes (Signed)
[facility]  [facility]   Brief Op Note     Physician(s): Aeisha Minarik M Zanayah Shadowens, MD    Assistant(s):  None    Pre-operative Diagnosis: Arthritis Hip    Post-operative Diagnosis: Diagnosis is same as preop diagnosis    Procedure(s) Performed: Left hip steroid injection                                                                                                                                                                                                                Anesthesia:  Local with 1% lidocaine    Complications: None    Estimated Blood Loss:  None    Blood Aministered:  None    Fluid Aministered:  Per Nursing    Tubes and Drains: None    Implant(s):  None    Specimens: None    Findings: Fluoroscopy used to guide left hip steroid injection with 80 mg DepoMedrol and  3 cc 0.5% Sensorcaine.    Patient was transferred from the procedure room to the ARC in stable condition.  Procedure note dictated.    Signed by: Nickolis Diel M Yudith Norlander, MD  [facility] Department  [facility] [phone]  [facility] [phone]

## 2016-02-25 NOTE — Progress Notes (Signed)
Discharge teaching completed. Written copy provided. Pt able to verbalize understanding of discharge teaching. Ambulated w/o issue. L hip dressing CDI.  Pt alert and oriented. Pt denies pain Vital signs stable Discharge criteria met. Discharged to home.

## 2016-02-25 NOTE — Plan of Care (Signed)
S: s/p: left hip injection with 80 mg Depomedrol and 3ml of 0.5% Marcaine. VSS. Pt tolerated procedure well. Pt transferred to ARC 5. Pt ambulated around unit without difficulty. Discharge instructions given and pt verbalized understanding. Pt to be discharged shortly.

## 2016-02-29 ENCOUNTER — Telehealth: Payer: Self-pay | Admitting: Medical

## 2016-02-29 NOTE — Telephone Encounter (Signed)
[facility]  [facility]   Telephone Call       Patient called today to discuss symptoms she is experiencing after her left hip steroid injection on 02/25/16.  The patient states immediately after the hip injection she started to feel dizzy.  Patient was given cranberry juice and she started to feel slightly better.  Vital signs remained stable.    However, since then the patient reports persistent dizziness and pre-syncopal spells.  She is also c/o paresthesias in her legs bilaterally.  The patient reports she has taken oral steroids in the past and never had an issue.      Recommended the patient call her PCP to make an appointment as her symptoms do not seem to be related to the steroid hip injection.  Patient agrees and will call to schedule.      Signed by: Roylene Reason, PA   [facility] Department   [place]   [phone]

## 2016-08-24 ENCOUNTER — Emergency Department: Payer: BLUE CROSS/BLUE SHIELD

## 2016-08-24 ENCOUNTER — Emergency Department
Admission: EM | Admit: 2016-08-24 | Discharge: 2016-08-25 | Disposition: A | Discharge status: Other Acute Inpatient Hospital | Payer: BLUE CROSS/BLUE SHIELD | Attending: Emergency Medicine | Admitting: Emergency Medicine

## 2016-08-24 DIAGNOSIS — I609 Nontraumatic subarachnoid hemorrhage, unspecified: Secondary | ICD-10-CM | POA: Insufficient documentation

## 2016-08-24 DIAGNOSIS — I10 Essential (primary) hypertension: Secondary | ICD-10-CM | POA: Insufficient documentation

## 2016-08-24 DIAGNOSIS — E039 Hypothyroidism, unspecified: Secondary | ICD-10-CM | POA: Insufficient documentation

## 2016-08-24 DIAGNOSIS — Z79899 Other long term (current) drug therapy: Secondary | ICD-10-CM | POA: Insufficient documentation

## 2016-08-24 DIAGNOSIS — R402412 Glasgow coma scale score 13-15, at arrival to emergency department: Secondary | ICD-10-CM | POA: Insufficient documentation

## 2016-08-24 DIAGNOSIS — R1310 Dysphagia, unspecified: Secondary | ICD-10-CM | POA: Diagnosis present

## 2016-08-24 DIAGNOSIS — R519 Headache, unspecified: Secondary | ICD-10-CM

## 2016-08-24 MED ORDER — ONDANSETRON HCL 4 MG/2ML IJ SOLN
4.0000 mg | Freq: Once | INTRAMUSCULAR | Status: AC
Start: 2016-08-24 — End: 2016-08-25
  Administered 2016-08-25: 4 mg via INTRAVENOUS
  Filled 2016-08-24: qty 2

## 2016-08-24 MED ORDER — FENTANYL CITRATE (PF) 50 MCG/ML IJ SOLN (WRAP)
50.0000 ug | Freq: Once | INTRAMUSCULAR | Status: AC
Start: 2016-08-24 — End: 2016-08-25
  Administered 2016-08-25: 50 ug via INTRAVENOUS
  Filled 2016-08-24: qty 2

## 2016-08-24 NOTE — ED Provider Notes (Signed)
EMERGENCY DEPARTMENT HISTORY AND PHYSICAL EXAM     Physician/Midlevel provider first contact with patient: 08/24/16 2319         Date: 08/24/2016  Patient Name: Tracy [name]    History of Presenting Illness     Chief Complaint   Patient presents with   . Headache     right frontal lobe       History Provided By: Patient    Chief Complaint: HA  Duration: tonight  Timing:  Acute  Location: head  Quality: stabbing  Severity: 10 out of 10  Exacerbating factors: none  Alleviating factors: none  Associated Symptoms: nausea, blurred vision, light sensitivity  Pertinent Negatives: vomitint    Additional History: Tracy [name] is a 66 y.o. female presenting to the ED with sudden onset of HA. She states she was eating dinner, began choking, then coughing when she suddenly felt a painful headache rated 10/10 in severity. She went to lay down and 1.5 hours later took an extra strength tylenol. She notes her pain has decreased from 10/10 to 6/10 in the ED. She also notes sensitivity to light, nausea, and R eye blurriness. She denies vomiting and previous h/o stroke.       PCP: Roni Bread, MD  SPECIALISTS:    No current facility-administered medications for this encounter.      No current outpatient prescriptions on file.     Facility-Administered Medications Ordered in Other Encounters   Medication Dose Route Frequency Provider Last Rate Last Dose   . acetaminophen (TYLENOL) tablet 650 mg  650 mg Oral Q4H PRN Ether Griffins, [place]       . chlorhexidine (PERIDEX) 0.12 % solution 15 mL  15 mL Mouth/Throat Q12H Hester, Tanna Furry, [place]       . docusate sodium (COLACE) capsule 100 mg  100 mg Oral BID Dara Lords [place], [place]       . famotidine (PEPCID) tablet 20 mg  20 mg Oral BID Ether Griffins, [place]        Or   . famotidine (PEPCID) injection 20 mg  20 mg Intravenous BID Ether Griffins, [place]       . hydrALAZINE (APRESOLINE) injection 10 mg  10 mg Intravenous Q3H PRN Ether Griffins, [place]       . labetalol (NORMODYNE,TRANDATE) injection 10 mg  10 mg Intravenous Q1H PRN Ether Griffins, [place]       . ondansetron [facility]) injection 4 mg  4 mg Intravenous Q6H PRN Ether Griffins, [place]       . Plasma-Lyte A IV infusion   Intravenous Continuous Ether Griffins, [place] 75 mL/hr at 08/25/16 9604     . sennosides (SENOKOT) 8.8 MG/5ML oral syrup 17.2 mg  17.2 mg Oral BID Ether Griffins, [place]           Past History     Past Medical History:  Past Medical History:   Diagnosis Date   . Depression     on prozac many yrs stable sees therapist   . Health care maintenance     colonscopy 1/08-5 yr f/u dr Durwin Nora  pap-1/12  mammo-, dexa-1/12,mammo 3/13  tetanius shot 3/12, with MMR and hep A/B  had pneumovax age 68  NL CXR 05/30/11   . History of colonoscopy 2007    MGM colon cancer 70  nl colonscopy 2007 dr Durwin Nora   . Hyperlipidemia     sees cards dr. Neomia Dear  9/12 chol  274 trigs 225 H53/L176 had myositis with statins,niacin-now on zetia had nl carotid duplex/thallium 1/12 Nl thallium-6/13  11/13 chol 260 H63/L173,ck= 305,cmp-nl creat 0.95-Referred to rheum 5/`14 re elevated cpk Saw Dr Neomia Dear 9/14-myalgia gone ,off tricor,but ck still even higher-echo/thallium ordred   . Hypertension    . Osteoporosis     o'penia had been on reclast per gyn and ergocalciferol early 2012. dexa 1/12-T scores -1.1, -0.5   . Routine history and physical examination of adult 02/2010    last Tetanus shot 2012   . Thyroid disease     hypothyroid on synthroid       Past Surgical History:  Past Surgical History:   Procedure Laterality Date   . EYE SURGERY  2011   . HYSTERECTOMY      complete laparoscopic hyterectomy,rectocele/cystocele repair 09/12/11 in [place]   . maxillofacial  1981   . NASAL SEPTUM SURGERY         Family History:  Family History   Problem Relation Age of Onset   . Osteoporosis Mother    . COPD Mother    . Diabetes Mother    . Arthritis Mother    . Cancer Father    . Cancer  Paternal Grandfather        Social History:  Social History   Substance Use Topics   . Smoking status: Never Smoker   . Smokeless tobacco: Never Used   . Alcohol use Yes       Allergies:  Allergies   Allergen Reactions   . Erythromycin    . Neosporin [Neomycin-Bacitracin Zn-Polymyx]    . Sulfa Drugs Cross Reactors        Review of Systems       Constitutional: Negative for fever or chills.   Neurological: Negative for speech changes, weakness, or numbness.   Eyes: Negative for eye pain. Positive for blurred vision (R eye) and light sensitivity.   HENT: No headache. Negative for sore throat, neck pain, or runny nose.   Cardiovascular: Negative for chest pain.   Respiratory: Negative for shortness of breath.   Gastrointestinal: Negative for abdominal pain, vomiting, diarrhea, or blood in stool. Positive for nausea.   Genitourinary: Negative for dysuria or hematuria.  Musculoskeletal: Negative for gait changes, joint pain or muscle pain.   Skin: Negative for itching or rash.   Hematological: Negative for easy bruising        Physical Exam   BP 156/70   Pulse 70   Temp 97.9 F (36.6 C) (Oral)   Resp 20   Ht 5\' 6"  (1.676 m)   Wt 72.6 kg   SpO2 98%   BMI 25.82 kg/m       Physical Exam   Constitutional: Oriented to person, place, and time and appears uncomfortable.    Head: Normocephalic and atraumatic.   Mouth/Throat: Oropharynx is clear and moist.   Eyes: Conjunctivae normal and EOM are normal. Pupils are equal, round, and reactive to light.    Neck: Normal range of motion. Neck supple.   Cardiovascular: Normal rate, regular rhythm, normal heart sounds and intact distal pulses.  No murmur heard.  Pulmonary/Chest: Effort normal and breath sounds normal.   Abdominal: Soft. Non distended. Non tender. No rebound or guarding  Musculoskeletal: No peripheral edema. No calf swelling or tenderness.    Neurological: Patient is alert and oriented to person, place, and time. No cranial nerve deficit.  GCS score is 15.    Skin: Skin is warm and dry.  No rash  Psychiatric: Affect normal.     Neurological: Patient is alert and oriented to person, place, and time. No cranial nerve deficit. Gait normal. GCS score is 15. 5/5 muscle strength in bilateral upper and lower extremities. No pronator drift. Normal finger to nose. No sensory deficits        Diagnostic Study Results     Labs -     Results     Procedure Component Value Units Date/Time    PT/APTT [[id number]] Collected:  08/24/16 2359     Updated:  08/25/16 0017     PT 12.6 sec      PT INR 0.9     PT Anticoag. Given Within 48 hrs. None     PTT 30 sec     CBC with differential [[id number]]  (Abnormal) Collected:  08/24/16 2359    Specimen:  Blood from Blood Updated:  08/25/16 0009     WBC 8.76 x10 3/uL      Hgb 13.6 g/dL      Hematocrit 62.1 %      Platelets 281 x10 3/uL      RBC 4.63 x10 6/uL      MCV 87.0 fL      MCH 29.4 pg      MCHC 33.7 g/dL      RDW 13 %      MPV 9.1 (L) fL      Neutrophils 56.0 %      Lymphocytes Automated 28.8 %      Monocytes 8.9 %      Eosinophils Automated 5.4 %      Basophils Automated 0.6 %      Immature Granulocyte 0.3 %      Nucleated RBC 0.0 /100 WBC      Neutrophils Absolute 4.91 x10 3/uL      Abs Lymph Automated 2.52 x10 3/uL      Abs Mono Automated 0.78 x10 3/uL      Abs Eos Automated 0.47 x10 3/uL      Absolute Baso Automated 0.05 x10 3/uL      Absolute Immature Granulocyte 0.03 x10 3/uL      Absolute NRBC 0.00 x10 3/uL           Radiologic Studies -   Radiology Results (24 Hour)     Procedure Component Value Units Date/Time    CT Head without Contrast [[id number]] Collected:  08/24/16 2304    Order Status:  Completed Updated:  08/24/16 2320    Narrative:       CT Head without contrast         Indication:    Right-sided headache, blurred vision on right side.  Asymmetric smile.    Comparison:  None available    Technique:  Axial CT images were obtained through the brain from the  skull base to the vertex without administration of IV contrast.   A  combination of automatic exposure control, adjustment of the mA and/or  kV  according to patient size and/or use of iterative reconstruction  technique was   utilized.    Findings:     There are small areas of high density measuring altogether approximately  1.8 x 0.8 cm at the left paramedian convexity posteriorly consistent  with the parenchymal contusions and or subarachnoid bleed. No midline  shift or mass effect. No acute territorial ischemia, midline shift or  mass effect. Ventricles and sulci are within normal limits for patient's  age. Gray-white matter differentiation is maintained.  The included paranasal sinuses and mastoid air cells are clear.    No displaced skull fracture.      Impression:          1. Left posterior paramedian area of the contusions and/or subarachnoid  bleed measuring in conglomerate about 1.8 x 0.8 cm. No midline shift.    Discussed with Dr. Roxan Hockey at 11:09 PM.    Disclaimer: CT sensitivity for detection of acute ischemia is limited,  and if there is high index of suspicion for infarct, MRI with  diffusion-weighted imaging should be obtained.    [place]  Teferra, MD   08/24/2016 11:15 PM      .    Medical Decision Making   I am the first provider for this patient.    I reviewed the vital signs, available nursing notes, past medical history, past surgical history, family history and social history.    Vital Signs-Reviewed the patient's vital signs.     Patient Vitals for the past 12 hrs:   BP Temp Pulse Resp   08/25/16 0000 156/70 - 70 20   08/24/16 2244 127/60 97.9 F (36.6 C) 76 20       Pulse Oximetry Analysis - Normal 99% on RA    Cardiac Monitor:  Rate: 60  Rhythm:  Sinus rhythm     EKG:  Interpreted by the EP.   Time Interpreted: 0804   Rate: 60   Rhythm: Sinus rhythm   Interpretation: Normal PR. Normal QRS. No ectopy. No significant ST changes. No STEMI.   Comparison: No prior study is available for comparison.        Old Medical Records: Nursing notes.     ED Course:    11:40 PM - Discussed with Dr. Deloria Lair, neurosurgery, who recommends transport to [facility]  11:44 PM - Discussed with Dr. Sherrie Mustache accepts pt for ED transfer    Provider Notes:     Critical Care Time:  CRITICAL CARE: The high probability of sudden, clinically significant deterioration in the patient's condition required the highest level of my preparedness to intervene urgently.    The services I provided to this patient were to treat and/or prevent clinically significant deterioration that could result in: death  Services included the following: chart data review, reviewing nursing notes and/or old charts, documentation time, consultant collaboration regarding findings and treatment options, medication orders and management, direct patient care, re-evaluations, vital sign assessments and ordering, interpreting and reviewing diagnostic studies/lab tests.    Aggregate critical care time was 35 minutes, which includes only time during which I was engaged in work directly related to the patient's care, as described above, whether at the bedside or elsewhere in the Emergency Department.  It did not include time spent performing other reported procedures or the services of residents, students, nurses or physician assistants.      For Hospitalized Patients:    1. Hospitalization Decision Time:  The decision to admit this patient was made by the emergency provider at 11:44PM on 08/24/2016     2. Aspirin: Aspirin was not given because the patient did not present with a stroke at the time of their Emergency Department evaluation.      Diagnosis     Clinical Impression:   1. Subarachnoid hemorrhage    2. Nonintractable headache, unspecified chronicity pattern, unspecified headache type        Treatment Plan:   ED Disposition     ED Disposition Condition Date/Time Comment    Transfer to Another  Facility  Fri Aug 25, 2016 12:24 AM             _______________________________      Attestations: This note is prepared by Garret Reddish, acting as scribe for Audley Hose, MD.    Audley Hose, MD - The scribe's documentation has been prepared under my direction and personally reviewed by me in its entirety.  I confirm that the note above accurately reflects all work, treatment, procedures, and medical decision making performed by me.    _______________________________     Cherlyn Roberts, MD  08/30/16 [phone]

## 2016-08-24 NOTE — ED Triage Notes (Signed)
Pt arrived to ED by neighbor.    Pt states she is having a severe headache around her right frontal lobe with some blurred vision around the right eye. Pt does have an asymmetrical smile. Pt stated S&S started around 2050. Pt does not have any drift in extremities. Pt does not have any slurred speech.    Pt states she took 2 tylenols with little relief.     BP 127/60   Pulse 76   Temp 97.9 F (36.6 C) (Oral)   Resp 20   Ht 5\' 6"  (1.676 m)   Wt 72.6 kg   SpO2 99%   BMI 25.82 kg/m      Pt is alert and oriented x4. Pt is currently in CT.

## 2016-08-25 ENCOUNTER — Inpatient Hospital Stay: Payer: BLUE CROSS/BLUE SHIELD

## 2016-08-25 ENCOUNTER — Inpatient Hospital Stay
Admission: EM | Admit: 2016-08-25 | Discharge: 2016-08-28 | DRG: 066 | Disposition: A | Discharge status: Home / Self Care | Payer: BLUE CROSS/BLUE SHIELD | Attending: Hospitalist | Admitting: Hospitalist

## 2016-08-25 DIAGNOSIS — I609 Nontraumatic subarachnoid hemorrhage, unspecified: Secondary | ICD-10-CM

## 2016-08-25 DIAGNOSIS — R1310 Dysphagia, unspecified: Secondary | ICD-10-CM | POA: Diagnosis present

## 2016-08-25 DIAGNOSIS — M791 Myalgia: Secondary | ICD-10-CM | POA: Diagnosis present

## 2016-08-25 DIAGNOSIS — E785 Hyperlipidemia, unspecified: Secondary | ICD-10-CM | POA: Diagnosis present

## 2016-08-25 DIAGNOSIS — M1610 Unilateral primary osteoarthritis, unspecified hip: Secondary | ICD-10-CM | POA: Diagnosis present

## 2016-08-25 DIAGNOSIS — S065X9A Traumatic subdural hemorrhage with loss of consciousness of unspecified duration, initial encounter: Secondary | ICD-10-CM | POA: Diagnosis present

## 2016-08-25 DIAGNOSIS — S065XAA Traumatic subdural hemorrhage with loss of consciousness status unknown, initial encounter: Secondary | ICD-10-CM | POA: Diagnosis present

## 2016-08-25 DIAGNOSIS — I10 Essential (primary) hypertension: Secondary | ICD-10-CM | POA: Diagnosis present

## 2016-08-25 DIAGNOSIS — E039 Hypothyroidism, unspecified: Secondary | ICD-10-CM | POA: Diagnosis present

## 2016-08-25 DIAGNOSIS — G2581 Restless legs syndrome: Secondary | ICD-10-CM | POA: Diagnosis present

## 2016-08-25 DIAGNOSIS — M810 Age-related osteoporosis without current pathological fracture: Secondary | ICD-10-CM | POA: Diagnosis present

## 2016-08-25 LAB — CBC AND DIFFERENTIAL
Absolute NRBC: 0 10*3/uL
Basophils Absolute Automated: 0.05 10*3/uL (ref 0.00–0.20)
Basophils Automated: 0.6 %
Eosinophils Absolute Automated: 0.47 10*3/uL (ref 0.00–0.70)
Eosinophils Automated: 5.4 %
Hematocrit: 40.3 % (ref 37.0–47.0)
Hgb: 13.6 g/dL (ref 12.0–16.0)
Immature Granulocytes Absolute: 0.03 10*3/uL
Immature Granulocytes: 0.3 %
Lymphocytes Absolute Automated: 2.52 10*3/uL (ref 0.50–4.40)
Lymphocytes Automated: 28.8 %
MCH: 29.4 pg (ref 28.0–32.0)
MCHC: 33.7 g/dL (ref 32.0–36.0)
MCV: 87 fL (ref 80.0–100.0)
MPV: 9.1 fL — ABNORMAL LOW (ref 9.4–12.3)
Monocytes Absolute Automated: 0.78 10*3/uL (ref 0.00–1.20)
Monocytes: 8.9 %
Neutrophils Absolute: 4.91 10*3/uL (ref 1.80–8.10)
Neutrophils: 56 %
Nucleated RBC: 0 /100 WBC (ref 0.0–1.0)
Platelets: 281 10*3/uL (ref 140–400)
RBC: 4.63 10*6/uL (ref 4.20–5.40)
RDW: 13 % (ref 12–15)
WBC: 8.76 10*3/uL (ref 3.50–10.80)

## 2016-08-25 LAB — COMPREHENSIVE METABOLIC PANEL
ALT: 30 U/L (ref 0–55)
AST (SGOT): 23 U/L (ref 5–34)
Albumin/Globulin Ratio: 1.4 (ref 0.9–2.2)
Albumin: 3.6 g/dL (ref 3.5–5.0)
Alkaline Phosphatase: 64 U/L (ref 37–106)
Anion Gap: 8 (ref 5.0–15.0)
BUN: 20 mg/dL — ABNORMAL HIGH (ref 7.0–19.0)
Bilirubin, Total: 0.3 mg/dL (ref 0.2–1.2)
CO2: 25 mEq/L (ref 22–29)
Calcium: 9.6 mg/dL (ref 8.5–10.5)
Chloride: 109 mEq/L (ref 100–111)
Creatinine: 0.7 mg/dL (ref 0.6–1.0)
Globulin: 2.5 g/dL (ref 2.0–3.6)
Glucose: 113 mg/dL — ABNORMAL HIGH (ref 70–100)
Potassium: 3.9 mEq/L (ref 3.5–5.1)
Protein, Total: 6.1 g/dL (ref 6.0–8.3)
Sodium: 142 mEq/L (ref 136–145)

## 2016-08-25 LAB — GFR
EGFR: >60
EGFR: >60

## 2016-08-25 LAB — ECG 12-LEAD
Atrial Rate: 60 {beats}/min
P Axis: 51 degrees
P-R Interval: 172 ms
Q-T Interval: 444 ms
QRS Duration: 76 ms
QTC Calculation (Bezet): 444 ms
R Axis: 12 degrees
T Axis: 24 degrees
Ventricular Rate: 60 {beats}/min

## 2016-08-25 LAB — LIPID PANEL
Cholesterol / HDL Ratio: 4.2
Cholesterol: 172 mg/dL (ref 0–199)
HDL: 41 mg/dL (ref 40–9999)
Triglycerides: 816 mg/dL — ABNORMAL HIGH (ref 34–149)

## 2016-08-25 LAB — BASIC METABOLIC PANEL
BUN: 21 mg/dL — ABNORMAL HIGH (ref 7.0–19.0)
CO2: 20 mEq/L — ABNORMAL LOW (ref 22–29)
Calcium: 9.1 mg/dL (ref 8.5–10.5)
Chloride: 109 mEq/L (ref 100–111)
Creatinine: 0.7 mg/dL (ref 0.6–1.0)
Glucose: 102 mg/dL — ABNORMAL HIGH (ref 70–100)
Potassium: 4.1 mEq/L (ref 3.5–5.1)
Sodium: 139 mEq/L (ref 136–145)

## 2016-08-25 LAB — HEMOLYSIS INDEX
Hemolysis Index: 18 (ref 0–18)
Hemolysis Index: 5 (ref 0–18)

## 2016-08-25 LAB — CBC
Absolute NRBC: 0 10*3/uL
Hematocrit: 36.6 % — ABNORMAL LOW (ref 37.0–47.0)
Hgb: 12.4 g/dL (ref 12.0–16.0)
MCH: 29.2 pg (ref 28.0–32.0)
MCHC: 33.9 g/dL (ref 32.0–36.0)
MCV: 86.1 fL (ref 80.0–100.0)
MPV: 9.3 fL — ABNORMAL LOW (ref 9.4–12.3)
Nucleated RBC: 0 /100 WBC (ref 0.0–1.0)
Platelets: 271 10*3/uL (ref 140–400)
RBC: 4.25 10*6/uL (ref 4.20–5.40)
RDW: 13 % (ref 12–15)
WBC: 7.81 10*3/uL (ref 3.50–10.80)

## 2016-08-25 LAB — PT AND APTT
PT INR: 0.9 (ref 0.9–1.1)
PT: 12.6 s (ref 12.6–15.0)
PTT: 30 s (ref 23–37)

## 2016-08-25 LAB — MAGNESIUM: Magnesium: 1.8 mg/dL (ref 1.6–2.6)

## 2016-08-25 LAB — PHOSPHORUS: Phosphorus: 4.5 mg/dL (ref 2.3–4.7)

## 2016-08-25 LAB — TROPONIN I
Troponin I: 0.08 ng/mL (ref 0.00–0.09)
Troponin I: 0.11 ng/mL — ABNORMAL HIGH (ref 0.00–0.09)

## 2016-08-25 MED ORDER — FENTANYL CITRATE (PF) 50 MCG/ML IJ SOLN (WRAP)
INTRAMUSCULAR | Status: AC
Start: 2016-08-25 — End: 2016-08-25
  Administered 2016-08-25: 07:00:00 25 ug via INTRAVENOUS
  Filled 2016-08-25: qty 2

## 2016-08-25 MED ORDER — LABETALOL HCL 5 MG/ML IV SOLN
10.0000 mg | INTRAVENOUS | Status: DC | PRN
Start: 2016-08-25 — End: 2016-08-28

## 2016-08-25 MED ORDER — LEVOTHYROXINE SODIUM 75 MCG PO TABS
75.0000 ug | ORAL_TABLET | Freq: Every day | ORAL | Status: DC
Start: 2016-08-25 — End: 2016-08-28
  Administered 2016-08-25 – 2016-08-28 (×4): 75 ug via ORAL
  Filled 2016-08-25 (×5): qty 1

## 2016-08-25 MED ORDER — FAMOTIDINE 10 MG/ML IV SOLN (WRAP)
20.0000 mg | Freq: Two times a day (BID) | INTRAVENOUS | Status: DC
Start: 2016-08-25 — End: 2016-08-25

## 2016-08-25 MED ORDER — CHLORHEXIDINE GLUCONATE 0.12 % MT SOLN
15.0000 mL | Freq: Two times a day (BID) | OROMUCOSAL | Status: DC
Start: 2016-08-25 — End: 2016-08-25

## 2016-08-25 MED ORDER — EZETIMIBE 10 MG PO TABS
10.0000 mg | ORAL_TABLET | Freq: Every day | ORAL | Status: DC
Start: 2016-08-25 — End: 2016-08-27
  Administered 2016-08-25 – 2016-08-26 (×2): 10 mg via ORAL
  Filled 2016-08-25 (×2): qty 1

## 2016-08-25 MED ORDER — HYDRALAZINE HCL 20 MG/ML IJ SOLN
10.0000 mg | INTRAMUSCULAR | Status: DC | PRN
Start: 2016-08-25 — End: 2016-08-28

## 2016-08-25 MED ORDER — FLUOXETINE HCL 10 MG PO CAPS
10.0000 mg | ORAL_CAPSULE | Freq: Every day | ORAL | Status: DC
Start: 2016-08-25 — End: 2016-08-26
  Administered 2016-08-25: 11:00:00 10 mg via ORAL
  Filled 2016-08-25: qty 1

## 2016-08-25 MED ORDER — PLASMA-LYTE A IV INFUSION
INTRAVENOUS | Status: DC
Start: 2016-08-25 — End: 2016-08-25

## 2016-08-25 MED ORDER — GADOBUTROL 1 MMOL/ML IV SOLN
6.9000 mL | Freq: Once | INTRAVENOUS | Status: AC | PRN
Start: 2016-08-25 — End: 2016-08-25
  Administered 2016-08-25: 08:00:00 6.9 mmol via INTRAVENOUS

## 2016-08-25 MED ORDER — ACETAMINOPHEN 325 MG PO TABS
650.0000 mg | ORAL_TABLET | ORAL | Status: DC | PRN
Start: 2016-08-25 — End: 2016-08-28
  Administered 2016-08-26 – 2016-08-28 (×2): 650 mg via ORAL
  Filled 2016-08-25 (×2): qty 2

## 2016-08-25 MED ORDER — SENNOSIDES 17.6MG/10ML UNIT DOSE SYRINGE
17.2000 mg | ORAL_SOLUTION | Freq: Two times a day (BID) | ORAL | Status: DC
Start: 2016-08-25 — End: 2016-08-28
  Administered 2016-08-26 – 2016-08-28 (×3): 17.2 mg via ORAL
  Filled 2016-08-25 (×6): qty 10

## 2016-08-25 MED ORDER — FENTANYL CITRATE (PF) 50 MCG/ML IJ SOLN (WRAP)
50.0000 ug | INTRAMUSCULAR | Status: DC | PRN
Start: 2016-08-25 — End: 2016-08-25
  Administered 2016-08-25: 50 ug via INTRAVENOUS
  Filled 2016-08-25: qty 2

## 2016-08-25 MED ORDER — DOCUSATE SODIUM 100 MG PO CAPS
100.0000 mg | ORAL_CAPSULE | Freq: Two times a day (BID) | ORAL | Status: DC
Start: 2016-08-25 — End: 2016-08-28
  Administered 2016-08-25 – 2016-08-28 (×5): 100 mg via ORAL
  Filled 2016-08-25 (×6): qty 1

## 2016-08-25 MED ORDER — ONDANSETRON HCL 4 MG/2ML IJ SOLN
4.0000 mg | Freq: Four times a day (QID) | INTRAMUSCULAR | Status: DC | PRN
Start: 2016-08-25 — End: 2016-08-28
  Administered 2016-08-25: 07:00:00 4 mg via INTRAVENOUS
  Filled 2016-08-25: qty 2

## 2016-08-25 MED ORDER — FAMOTIDINE 20 MG PO TABS
20.0000 mg | ORAL_TABLET | Freq: Two times a day (BID) | ORAL | Status: DC
Start: 2016-08-25 — End: 2016-08-25

## 2016-08-25 NOTE — Progress Notes (Signed)
[facility] HOSPITALIST PROGRESS NOTE    Date Time: 08/25/16 11:58 AM  Patient Name: Tracy [name],Tracy [name]  Attending Physician: Les Pou, MDMD      Assessment:     Active Hospital Problems    Diagnosis   . SDH (subdural hematoma)   . Subarachnoid hemorrhage   . SAH (subarachnoid hemorrhage)       66 yo F transferred from OSH for mgmt of sah on 08/24/2016. NSGY consulted. MRI brain unchanged and MRV head negative.    Plan:     #Left posterior parafalcine convexity SAH  - MRI brain unchanged and MRV head negative.  - Appreciate NSGY input  - Repeat Head CT for neuro decline  - Continue neuro checks  - Continue tylenol for headache  - PT/OT/SLP    #Dysphagia  - GI consulted  - NPO for possible EGD    #Hypothyroidism  - Continue synthroid    #HTN  - SBP goal 100 -150    #HLD  - Unable to take statins due to hepatic side effects and myalgias  - Continue Zetia    #Depression  - Continue Prozac    #Hx of Restless leg syndrome and OA - Noted    DVT prophylaxis: SCDs    Subjective     CC: Subarachnoid hemorrhage    Interval History/24 hour events: sensation of food getting stuck in her esophagus with eating; GI consulted; NPO for possible EGD    HPI: per admitting provider: "66 yo F transferred from OSH for mgmt of sah on 08/24/2016. The patient reports she was eating dinner this evening and she felt like a piece of food "went down the wrong pipe"/ got stuck and she needed to cough it up. On her second cough/retch she experience a HA that started in the front of the right side of her head and moved down the right towards her neck. She says the HA was the worst HA of her live and was 10/10.  She took 2 extra strength tylenol. She also had mild blurry vision of her right peripheral vision and mild +photophobia that persists. She also noticed some weakness/tremors in her left arm and leg while being examined at OSH. She denies use of NSAIDs or AC, but does take Celebrex for hip arthritis. She reports minor HA at this time, it has  improved with pain medicine, she denies N/V, numbness/tingling, blurry or double vision, CP, or SOB "    Review of Systems:   General - no pain   Resp - no SOB   CVS - no CP, no CAD   GI - no nausea, no diarrhea   CNS - no visual deficit, no weakness    Physical Exam:     Vitals:    08/25/16 0637 08/25/16 0649 08/25/16 0700 08/25/16 0800   BP: 126/69 121/64 131/70 148/70   Pulse: (!) 56 (!) 57 (!) 59 67   Resp:   18 20   Temp:    97.4 F (36.3 C)   TempSrc:       SpO2: 92% 98% 98% 97%   Weight:       Height:              Intake/Output Summary (Last 24 hours) at 08/25/16 1158  Last data filed at 08/25/16 0900   Gross per 24 hour   Intake              610 ml   Output  0 ml   Net              610 ml       General: Awake, alert, oriented x 3; no acute distress.  HEENT: PERRLA, eomi, sclera anicteric  oropharynx clear without lesions, mucous membranes moist  Neck: supple, no lymphadenopathy, no thyromegaly, no JVD, no carotid bruits  Cardiovascular: regular rate and rhythm, no murmurs, rubs or gallops  Lungs: clear to auscultation bilaterally, without wheezing, rhonchi, or rales  Abdomen: soft, non-tender, non-distended; no palpable masses, no hepatosplenomegaly, normoactive bowel sounds, no rebound or guarding  Extremities: no clubbing, cyanosis, or edema  Neuro: cranial nerves grossly intact, strength 5/5 in upper and lower extremities, sensation intact, gross visual field test intact.  Skin: no rashes or lesions noted  GU: no CVA tenderness     Meds:   Medications were reviewed by me:  Current Facility-Administered Medications   Medication Dose Route Frequency   . docusate sodium  100 mg Oral BID   . ezetimibe  10 mg Oral Daily   . FLUoxetine  10 mg Oral Daily   . levothyroxine  75 mcg Oral Daily at 0600   . senna  17.2 mg Oral BID       Labs:   Labs reviewed personally include:    Recent Labs  Lab 08/25/16  0328 08/24/16  2359   WBC 7.81 8.76   Hgb 12.4 13.6   Hematocrit 36.6* 40.3   Platelets 271 281       Recent Labs  Lab 08/24/16  2359   PT 12.6   PT INR 0.9   PTT 30         Recent Labs  Lab 08/25/16  0328 08/24/16  2359   Sodium 139 142   Potassium 4.1 3.9   Chloride 109 109   CO2 20* 25   BUN 21.0* 20.0*   Creatinine 0.7 0.7   EGFR >60.0 >60.0   Glucose 102* 113*   Calcium 9.1 9.6      Recent Labs  Lab 08/24/16  2359   Alkaline Phosphatase 64   Bilirubin, Total 0.3   Protein, Total 6.1   Albumin 3.6   ALT 30   AST (SGOT) 23            Microbiology Results     Procedure Component Value Units Date/Time    MRSA culture [[id number]] Collected:  08/25/16 0328    Specimen:  Body Fluid from Nares and Throat Updated:  08/25/16 0544          Imaging personally reviewed, including:  Radiology Results (24 Hour)     Procedure Component Value Units Date/Time    MRI Brain W WO Contrast [[id number]] Collected:  08/25/16 3086    Order Status:  Completed Updated:  08/25/16 0821    Narrative:       HISTORY: 66 years old, small SAH,     6.9 cc Gadavist, subarachnoid  hemorrhage,         TECHNIQUE: Routine MRI brain with and without contrast.   COMPARISON: CT head dated 08/24/2016.  FINDINGS:  There is again a small focal area of subarachnoid hemorrhage about the  left parietal paramedian region, without appreciable change. Underlying  brain parenchyma appear within normal limit, without evidence of  abnormal enhancement. No evidence of a dural venous sinus thrombosis.     There is incidental cisterna magna of the posterior fossa.  The brain parenchyma is unremarkable for age   .  The diffusion weighted images are normal without acute ischemia.     The ventricles and the basal cisterns are normal without mass effect.     The paranasal sinuses are normal.           Impression:          1.  Small focal area of subarachnoid hemorrhage about the left  paramedian parietal  lobe, unchanged.         Einar Pheasant, MD   08/25/2016 8:17 AM    MRV Head W WO Contrast [[id number]] Collected:  08/25/16 0811    Order Status:  Completed Updated:   08/25/16 0820    Narrative:       HISTORY: 66 years old, INTRACRANIAL HEMORRHAGE,     subarachnoid  hemorrhage         TECHNIQUE:     Intracranial TOF     MR venogram without contrast, TRICKS  MR venogram with contrast.   COMPARISON: None.  FINDINGS:  The sagittal dural venous sinus is normal without stenosis or  thrombosis.  The straight dural venous sinus is normal without stenosis or  thrombosis.  These transverse sinus and the sigmoid dural venous sinuses are normal  without stenosis or thrombosis.  Proximal internal jugular veins are normal without stenosis or  thrombosis.         TECHNIQUE: Routine 3D TOF intracranial MRA without.  COMPARISON: None.  FINDINGS:     There is no evidence of aneurysm.  There is no hemodynamically significant stenosis.          Impression:          1.  Negative intracranial MR venogram.  2.  Negative intracranial MR arteriogram.      Einar Pheasant, MD   08/25/2016 8:16 AM            Safety Checklist:     DVT prophylaxis:  CHEST guideline (See page [id number]) Mechanical   Foley:  [place] Rn Foley protocol Not present   IVs:  Peripheral IV   PT/OT: Ordered   Daily CBC & or Chem ordered:  SHM/ABIM guidelines (see #5) Yes, due to clinical and lab instability   Reference for approximate charges of common labs: CBC auto diff - $76  BMP - $99  Mg - $79    Lines:     Patient Lines/Drains/Airways Status    Active PICC Line / CVC Line / PIV Line / Drain / Airway / Intraosseous Line / Epidural Line / ART Line / Line / Wound / Pressure Ulcer / NG/OG Tube     Name:   Placement date:   Placement time:   Site:   Days:    Peripheral IV 08/24/16 Left Antecubital  08/24/16        Antecubital    1    Peripheral IV 08/25/16 Left Forearm  08/25/16    0400    Forearm    less than 1                 Disposition:   Today's date: 08/25/2016  Length of Stay: 0  Anticipated medical stability for discharge: July,  8 - Afternoon  Reason for ongoing hospitalization: GI consult; possible EGD  Anticipated discharge needs:  to be determined    Signed by: Leanor Rubenstein, FNP  Discussed with Attending MD: Les Pou, MD

## 2016-08-25 NOTE — Plan of Care (Signed)
Problem: Safety  Goal: Patient will be free from injury during hospitalization  Outcome: Progressing   08/25/16 2229   Goal/Interventions addressed this shift   Patient will be free from injury during hospitalization  Provide and maintain safe environment;Assess patient's risk for falls and implement fall prevention plan of care per policy;Use appropriate transfer methods;Ensure appropriate safety devices are available at the bedside   Bed in lowest position, bed alarms are on    Problem: Pain  Goal: Pain at adequate level as identified by patient  Outcome: Progressing   08/25/16 2229   Goal/Interventions addressed this shift   Pain at adequate level as identified by patient Identify patient comfort function goal;Assess pain on admission, during daily assessment and/or before any "as needed" intervention(s);Evaluate if patient comfort function goal is met   Pain assessed using numeric pain scale    Problem: Neurological Deficit  Goal: Neurological status is stable or improving  Outcome: Progressing   08/25/16 2229   Goal/Interventions addressed this shift   Neurological status is stable or improving Monitor/assess/document neurological assessment (Stroke: every 4 hours);Perform CAM Assessment   Neuro exam stable. See flowsheets for more detailed exam    Comments: Safety checks performed, neuro assessment done every 4 hours

## 2016-08-25 NOTE — ED Provider Notes (Signed)
[facility] EMERGENCY DEPARTMENT H&P       CLINICAL SUMMARY        Diagnosis:    Final diagnoses:   SAH (subarachnoid hemorrhage)       MDM notes:    Tracy [name] is a 66 y.o. female with spontaneous head bleed secondary to cough?  ICU and neurosurgery at bedside on my evaluation.  Neurosurgery was already there and  ICU came in soon thereafter.  I was not aware of elevated troponin until after admission to ICU.  Neurologically intact.  I personally discussed with ICU attending.  He is aware of elevated troponin      Clinical Course in the ED:  1:14 AM: Neurosurgery at bedside.  1:36 AM: D/w Neurosurgery. [place] will admit to Neuro ICU.         Disposition:    ED Disposition     ED Disposition Condition Date/Time Comment    Admit  Fri Aug 25, 2016  1:35 AM Admitting Physician: Nancie Neas RAFI [[id number]]   Diagnosis: Subarachnoid hemorrhage [430.ICD-9-CM]   Estimated Length of Stay: 7+ Days   Tentative Discharge Plan?: Home or Self Care [1]   Patient Class: Inpatient [101]                    CLINICAL INFORMATION        HPI:    Chief Complaint: Intracranial Hemorrhage      Tracy [name] is a 66 y.o. female h/o high cholesterol BIBA from [facility] presenting with acute onset stabbing HA a/w nausea, blurred vision, photophobia, dysphagia, tremors a/w SAH x yesterday evening.   Pt was eating when she started choking d/t sudden HA. Pt tried to sleep and took extra strength tylenol at home with no relief.   Pt was seen at [facility] where CT head showed L SAH. Pt transferred to [facility] for neurosurgery consult.   Upon arrival to [facility], pt denies HA, blurry vision, numbness/paresthesias, fever, n/v/d, urinary/fecal incontinence, weakness, and dizziness.    History provided by Patient, EMS.    PCP: Monte Fantasia, MD      ROS:    Review of Systems   Neurological: Positive for tremors and headaches.   All other systems reviewed and are negative.        Physical Exam:    Pulse 62  BP 158/69  Resp 14  SpO2 95 %  Temp 98 F (36.7  C)    Physical Exam   Constitutional: She is oriented to person, place, and time. She appears well-developed and well-nourished. No distress.   HENT:   Head: Normocephalic and atraumatic.   Nose: Nose normal.   Mouth/Throat: Oropharynx is clear and moist.   Eyes: Conjunctivae are normal. Right eye exhibits no discharge. Left eye exhibits no discharge.   Neck: Normal range of motion. Neck supple.   Cardiovascular: Normal rate, regular rhythm and normal heart sounds.  Exam reveals no friction rub.    No murmur heard.  Pulmonary/Chest: Effort normal and breath sounds normal. No respiratory distress. She has no wheezes. She has no rales. She exhibits no tenderness.   Abdominal: Soft. Bowel sounds are normal. She exhibits no distension. There is no tenderness. There is no rebound and no guarding.   Musculoskeletal: Normal range of motion. She exhibits no edema or tenderness.   Neurological: She is alert and oriented to person, place, and time.   NAD, A+Ox3, Normal smile, 5/5 equal bilateral grip, bicep, tricep, foot dorsi and plantar  flexion.  Equal bilateral sensation to light touch all extremities. No pronator drift.   Skin: Skin is warm and dry. No rash noted. She is not diaphoretic. No erythema. No pallor.   Psychiatric: She has a normal mood and affect. Her behavior is normal.   Nursing note and vitals reviewed.              PAST HISTORY        Primary Care Provider: Monte Fantasia, MD        PMH/PSH:    Tracy [name] has a past medical history of Depression; Health care maintenance; History of colonoscopy (2007); Hyperlipidemia; Hypertension; Osteoporosis; Routine history and physical examination of adult (02/2010); and Thyroid disease.  She has a past surgical history that includes Nasal septum surgery; maxillofacial (1981); Eye surgery (2011); and Hysterectomy.    Social/Family History:    She reports that she has never smoked. She has never used smokeless tobacco. She reports that she drinks alcohol. Her  drug history is not on file.  Her family history includes Arthritis in her mother; COPD in her mother; Cancer in her father and paternal grandfather; Diabetes in her mother; Osteoporosis in her mother.    Listed Medications on Arrival:    Previous Medications    ALIROCUMAB (PRALUENT) 150 MG/ML SOLUTION PEN-INJECTOR    Inject into the skin.    CHOLECALCIFEROL (VITAMIN D) 1000 UNITS CAPSULE    Take 1,000 Units by mouth daily.      CO-ENZYME Q-10 30 MG CAPSULE    Take 30 mg by mouth 3 (three) times daily.      DESOXIMETASONE (TOPICORT) 0.05 % CREAM    Apply topically 2 (two) times daily.      EZETIMIBE (ZETIA) 10 MG TABLET    Take 10 mg by mouth daily.      FENOFIBRATE (TRICOR) 145 MG TABLET    Take 145 mg by mouth daily.      FLUOXETINE (PROZAC) 10 MG TABLET    Take 10 mg by mouth daily.    FLUOXETINE (PROZAC) 20 MG TABLET    Take 20 mg by mouth daily.      FLUTICASONE (FLONASE) 50 MCG/ACT NASAL SPRAY    INSTILL SPRAY IN NOSTRIL TWICE A DAY    GABAPENTIN (NEURONTIN) 300 MG CAPSULE    TAKE 1 TABLET AT NIGHT    GLUCOSAMINE-CHONDROITIN 500-400 MG TABLET    Take 1 tablet by mouth 3 (three) times daily.    LEVOTHYROXINE (SYNTHROID, LEVOTHROID) 75 MCG TABLET    TAKE 1 TABLET BY MOUTH DAILY    MULTIPLE VITAMINS-MINERALS (MULTIVITAMIN WITH MINERALS) TABLET    Take 1 tablet by mouth daily.    OMEGA-3 FATTY ACIDS (OMEGA-3 FISH OIL) 500 MG CAP    Take by mouth.    WHITE PETROLATUM-MINERAL OIL (SYSTANE NIGHTTIME) OINTMENT    APPLY 1/4'' STRIP INTO BOTH EYES ONCE OR TWICE A DAY      Allergies: She is allergic to erythromycin; neosporin [neomycin-bacitracin zn-polymyx]; and sulfa drugs cross reactors.          VISIT INFORMATION    Medications Given in the ED:    ED Medication Orders     None          Procedures:          Interpretations:       O2 sat-           saturation: 95 %; Oxygen use: room air; Interpretation: Normal  Critical Care Time (not including procedures): 30-74 minutes.   Due to the high risk of critical illness  or multi-organ failure at initial presentation and/or during ED course.    System(s) at risk for compromise:  CNS  Critical Diagnosis:   1. SAH (subarachnoid hemorrhage)         The patient was Hypotensive:   No     The patient was Hypoxic:   No     This does not including time spent performing other reported procedures or services.   Critical care time involved full attention to the patient's condition and included:   Review of nursing notes and/or old charts - Yes  Documentation time - Yes  Care, transfer of care, and discharge plans - Yes  Obtaining necessary history from family, EMS, nursing home staff and/or treating physicians - Yes  Review of medications, allergies, and vital signs - Yes   Consultant collaboration on findings and treatment options - Yes  Ordering, interpreting, and reviewing diagnostic studies/tab tests - Yes                  RESULTS        Lab Results:    Lab Results    None           Radiology Results:    MRI Brain W WO Contrast    (Results Pending)   MRA Head WO Contrast    (Results Pending)   MRA Head W WO Contrast    (Results Pending)             Scribe Attestation:    I was acting as a Neurosurgeon for Bari Mantis, MD on Tomei,Aloha MICHELLE  Treatment Team: Scribe: Rondall Allegra   I am the first provider for this patient and I personally performed the services documented. Treatment Team: Scribe: Rondall Allegra is scribing for me on Courtwright,Jacquiline MICHELLE. This note accurately reflects work and decisions made by me.  Bari Mantis, MD            Leanora Ivanoff, MD  08/25/16 [phone]

## 2016-08-25 NOTE — Progress Notes (Signed)
NEUROSURGERY PROGRESS NOTE    Date Time: 08/25/16 7:15 PM  Patient Name: Tracy [name],Tracy [name]  Consulting Attending Physician: Michaelle Birks, MD  Covered By: Carmie End MD    Interim History:   HD 2  No complaints  Seen by GI    Medications:     Current Facility-Administered Medications   Medication Dose Route Frequency   . docusate sodium  100 mg Oral BID   . ezetimibe  10 mg Oral Daily   . FLUoxetine  10 mg Oral Daily   . levothyroxine  75 mcg Oral Daily at 0600   . senna  17.2 mg Oral BID         Physical Exam:     Vitals:    08/25/16 1600   BP: 122/66   Pulse: 70   Resp: 15   Temp: 97.9 F (36.6 C)   SpO2: 97%       Intake and Output Summary (Last 24 hours) at Date Time    Intake/Output Summary (Last 24 hours) at 08/25/16 1915  Last data filed at 08/25/16 0900   Gross per 24 hour   Intake              610 ml   Output                0 ml   Net              610 ml     Neuro exam:  Mental status:   Alert, awake, oriented x 3               Cranial Nerve:  Pupil/Reactive: PERL  [id number]                Visual field: full  Face: symmetric                           Motor: no drift    Sensory: wnl    Reflexes:  Babinski: no   Hoffmann's: no      Labs:     Recent BMP   Recent Labs      08/25/16   0328   Glucose  102*   BUN  21.0*   Creatinine  0.7   Calcium  9.1   Sodium  139   Potassium  4.1   Chloride  109   CO2  20*     Recent CBC WITH DIFF   Recent Labs      08/25/16   0328   RBC  4.25   Hgb  12.4   Hematocrit  36.6*   MCV  86.1   MCHC  33.9   RDW  13   MPV  9.3*       Rads:   Radiological Procedure reviewed.  MRI/V: left parietal SAH  Assessment:   Non-traumatic SAH   Unusual location for an aneurysm unless mycotic but no risk factors   The onus is on [place] to rule AVM or dural AF fistula    Rec:   please obtain cerebral arteriogram: inpatient vs outpatient    Signed by: Brunetta Genera  Date/Time: 08/25/16 7:15 PM  Cerebrum MD  [phone]

## 2016-08-25 NOTE — Plan of Care (Signed)
Problem: Safety  Goal: Patient will be free from injury during hospitalization  Outcome: Progressing   08/25/16 0640   Goal/Interventions addressed this shift   Patient will be free from injury during hospitalization  Provide and maintain safe environment;Use appropriate transfer methods;Assess patient's risk for falls and implement fall prevention plan of care per policy       Problem: Pain  Goal: Pain at adequate level as identified by patient  Outcome: Progressing   08/25/16 0640   Goal/Interventions addressed this shift   Pain at adequate level as identified by patient Identify patient comfort function goal;Assess for risk of opioid induced respiratory depression, including snoring/sleep apnea. Alert healthcare team of risk factors identified.;Assess pain on admission, during daily assessment and/or before any "as needed" intervention(s);Reassess pain within 30-60 minutes of any procedure/intervention, per Pain Assessment, Intervention, Reassessment (AIR) Cycle;Evaluate patient's satisfaction with pain management progress;Evaluate if patient comfort function goal is met       Problem: Neurological Deficit  Goal: Neurological status is stable or improving  Outcome: Progressing   08/25/16 0640   Goal/Interventions addressed this shift   Neurological status is stable or improving Monitor/assess/document neurological assessment (Stroke: every 4 hours);Monitor/assess NIH Stroke Scale;Re-assess NIH Stroke Scale for any change in status

## 2016-08-25 NOTE — Consults (Signed)
[facility]  CONSULTATION NOTE  [facility] call: [phone], [phone]  [facility] call: [phone]  After hours call [phone]        Date Time: 08/25/16 6:21 PM  Patient Name: [name],[name]  Requesting Physician: Les Pou, MD       Reason for Consultation:   Dysphagia    Assessment and Plan:   Assessment:  1. Dysphagia: This has been present for the last ~30-40 years with prior unclear GI evaluation when she was in her 21's. Episode of dysphagia yesterday resulted in cough followed by headaches found to be 2/2 subarachnoid hemorrhage. DDx includes esophagitis, stricture, web, ring, motility disorder.  2. Subarachnoid hemorrhage: plan as per Neurosurgery .    Plan:  1. Recommend barium esophagram this weekend--to eval for dysmotility.  2. Recommend NPO after midnight Sunday night with plan for EGD Monday 08/28/16 as long as cleared by Neurosurgery.    History:   Tracy [name] is a 66 y.o. female who presents to the hospital on 08/25/2016 from OSH with intracranial hemorrhage which onset after cough / regurgitation of food. The pt states yesterday she was eating rosemary potatoes, chicken and broccoli when she felt the food get stuck in her chest. She states usually when this happens she can drink sips of water and push the food through. The pt states she tried this, and she regurgitated both the food and the water. The pt states she was not coughing hard. She tried it again, and she reports she developed a severe headache, which prompted ED evaluation.    The pt states she has had difficulty swallowing since she was in her 56's. The pt states approximately twice a month she will have an episode of food getting stuck mid chest. She states this tends to occur most commonly with foods of different textures, specifically chicken and rice. She denies dysphagia with liquids. She denies odynophagia. The pt states she was seen by a GI when she was in her 20's and had a barium esophagram, but  she does not recall the results. She states she has never had an upper endoscopy. The pt reports she had a maxillofacial surgery and has wires along her orbits and jaw, but she denies any difficulty opening her mouth or chewing. The pt states both her mom and dad had similar difficulties swallowing, but they both passed away last year.    Past Medical History:     Past Medical History:   Diagnosis Date   . Depression     on prozac many yrs stable sees therapist   . Health care maintenance     colonscopy 1/08-5 yr f/u dr Durwin Nora  pap-1/12  mammo-, dexa-1/12,mammo 3/13  tetanius shot 3/12, with MMR and hep A/B  had pneumovax age 56  NL CXR 05/30/11   . History of colonoscopy 2007    MGM colon cancer 70  nl colonscopy 2007 dr Durwin Nora   . Hyperlipidemia     sees cards dr. Neomia Dear  9/12 chol 274 trigs 225 H53/L176 had myositis with statins,niacin-now on zetia had nl carotid duplex/thallium 1/12 Nl thallium-6/13  11/13 chol 260 H63/L173,ck= 305,cmp-nl creat 0.95-Referred to rheum 5/`14 re elevated cpk Saw Dr Neomia Dear 9/14-myalgia gone ,off tricor,but ck still even higher-echo/thallium ordred   . Hypertension    . Osteoporosis     o'penia had been on reclast per gyn and ergocalciferol early 2012. dexa 1/12-T scores -1.1, -0.5   . Routine history and physical examination of adult 02/2010  last Tetanus shot 2012   . Thyroid disease     hypothyroid on synthroid       Past Surgical History:     Past Surgical History:   Procedure Laterality Date   . EYE SURGERY  2011   . HYSTERECTOMY      complete laparoscopic hyterectomy,rectocele/cystocele repair 09/12/11 in [place]   . maxillofacial  1981   . NASAL SEPTUM SURGERY         Family History:     Family History   Problem Relation Age of Onset   . Osteoporosis Mother    . COPD Mother    . Diabetes Mother    . Arthritis Mother    . Cancer Father    . Cancer Paternal Grandfather        Social History:     Social History     Social History   . Marital status: Single     Spouse name: N/A   .  Number of children: N/A   . Years of education: N/A     Social History Main Topics   . Smoking status: Never Smoker   . Smokeless tobacco: Never Used   . Alcohol use Yes   . Drug use: Unknown   . Sexual activity: Not on file     Other Topics Concern   . Not on file     Social History Narrative   . No narrative on file       Allergies:     Allergies   Allergen Reactions   . Erythromycin    . Neosporin [Neomycin-Bacitracin Zn-Polymyx]    . Sulfa Drugs Cross Reactors        Medications:     Current Facility-Administered Medications   Medication Dose Route Frequency   . docusate sodium  100 mg Oral BID   . ezetimibe  10 mg Oral Daily   . FLUoxetine  10 mg Oral Daily   . levothyroxine  75 mcg Oral Daily at 0600   . senna  17.2 mg Oral BID       Review of Systems:   General:  Patient denies lack of appetite, night sweats, weight loss, fatigue, fever.   HEENT:  Patient denies headache, hoarseness   Cardiovascular:  Patient denies swelling of hands/feet, fainting/blacking out, chest pain.   Respiratory:  Patient denies chronic cough, difficulty breathing, wheezing.   Genitourinary:  Patient denies blood in urine, dark urine  Musculoskeletal: Patient denies joint pain, joint stiffness, joint swelling.   Skin:  Patient denies itching, rash.   Neurologic:  Patient denies dizziness, loss of consciousness, fainting, confusion  Heme/Lymphatic:  Patient denies easy bruising.       Pertinent positives noted in HPI.    Physical Exam:     Vitals:    08/25/16 1600   BP: 122/66   Pulse: 70   Resp: 15   Temp: 97.9 F (36.6 C)   SpO2: 97%       General appearance: Well developed, well nourished, appears stated age and in NAD  Eyes: Sclera anicteric, pink conjunctivae, no ptosis  ENMT: mucous membranes moist, nose and ears appear normal.    Chest: Non labored respirations, no audible wheezing, no clubbing or cyanosis  CV:  Regular rate and rhythm  Abdomen: soft, +BS, non-tender, non-distended, no masses or organomegaly  Skin: Normal  color and turgor, no rashes, no suspicious skin lesions noted  Neuro: CN II-XII grossly intact.  No gross movement disorders noted.  Mental status: Appropriate affect, alert and oriented x 3    Labs Reviewed:     Recent Labs      08/25/16   0328  08/24/16   2359   WBC  7.81  8.76   Hgb  12.4  13.6   Hematocrit  36.6*  40.3   Platelets  271  281   MCV  86.1  87.0       Recent Labs      08/25/16   0328  08/24/16   2359   Sodium  139  142   Potassium  4.1  3.9   Chloride  109  109   CO2  20*  25   BUN  21.0*  20.0*   Creatinine  0.7  0.7   Glucose  102*  113*   Calcium  9.1  9.6   Magnesium  1.8   --    Phosphorus  4.5   --        Recent Labs      08/24/16   2359   AST (SGOT)  23   ALT  30   Alkaline Phosphatase  64   Bilirubin, Total  0.3   Protein, Total  6.1   Albumin  3.6       Recent Labs      08/24/16   2359   PTT  30   PT  12.6   PT INR  0.9        Radiology:   Radiological Procedure reviewed:    MRI Brain W WO Contrast 08/25/16:  Impression:  1. Small focal area of subarachnoid hemorrhage about the left  paramedian parietal lobe, unchanged.    MRV Head W WO Contrast 08/25/16:  Impression:  1. Negative intracranial MR venogram.  2. Negative intracranial MR arteriogram.

## 2016-08-25 NOTE — H&P (Signed)
[facility]- Neuro Critical Care Service [facility])                                                       Admission/Consultation  Note        Date Time: 08/25/16 1:24 AM  Patient Name: [name],[name]  Attending Physician: Leanora Ivanoff, MD  Room: S 16/S 16  Admit Date: 08/25/2016  LOS: 0 days      Chief Complaint   Patient presents with   . Intracranial Hemorrhage     Presentation:   66 yo F transferred from OSH for mgmt of sah on 08/24/2016. The patient reports she was eating dinner this evening and she felt like a piece of food "went down the wrong pipe"/ got stuck and she needed to cough it up. On her second cough/retch she experience a HA that started in the front of the right side of her head and moved down the right towards her neck. She says the HA was the worst HA of her live and was 10/10.  She took 2 extra strength tylenol. She also had mild blurry vision of her right peripheral vision and mild +photophobia that persists. She also noticed some weakness/tremors in her left arm and leg while being examined at OSH. She denies use of NSAIDs or AC, but does take Celebrex for hip arthritis. She reports minor HA at this time, it has improved with pain medicine, she denies N/V, numbness/tingling, blurry or double vision, CP, or SOB .    ASSESSMENT               PLAN  Left posterior parafalcine convexity SAH  DDx includes - RCVS, venous sinus thrombosis, amyloid angiopathy       PMH:     Hypothyroidism  HTN  HLD  Restless leg syndrome  OA  depression    Baseline MRS NEURO: Admit to NSICU   Q2h neuro exams   MRI W WO   MRV and MRA   Consider calcium channel blocker for anti-hypertensives if necessary     -Goals:   Perfusion goals: 100-150  Na+ goal: 135-145  Pain/Sedation: PRN fentanyl and oxycodone for HA  Drains: none  PT/OT/Speech/swallow eval- PT/OT    CV: HD stable, no history of HTN   - PRN labetalol/hydralazine per goals  - HLD- unable to take statins due to hepatic side effects and myalgias.      PULM: protecting airway.     GI: NAI  GI PPX: pepcid  Nutrition: advance diet    RENAL: normal renal function  I/O Goal:     WG:NFAOZHYQ, no leukocytosis     HEME: H/H stable     SCD  Pharmacologic ppx: not indicated due to ICH     ENDO/RHEUM: maintain euglycemia  Glucose: 140-180    Lines/Foley:PIV, no foley  Daily labs: Y    Family Meeting:   Patient is currently able to make medical decisions. Patients proxy is unknown and was not present  Discussed current diagnoses, prognosis and goals of care.   Code status was also discussed and is full .   Code status No Order    DISPO:NSICU      CC: HA    HPI   History obtained from: patient  Patient transferred from another facility: yes, [place]  Symptom ones date and time(specify last seen nl or  witnessed)       ED MEDS: Medications - No data to display         Review of Systems     Comprehensive review of more than 10 systems including constitutional, eyes, ears, nose, mouth, throat, cardiovascular, GI, GU, musculoskeletal, integumentary, respiratory, neurologic, psychiatric, and endocrine is negative other than what is mentioned already above and in the history of present illness.    Past Medical Hx     Past Medical History:   Diagnosis Date   . Depression     on prozac many yrs stable sees therapist   . Health care maintenance     colonscopy 1/08-5 yr f/u dr Durwin Nora  pap-1/12  mammo-, dexa-1/12,mammo 3/13  tetanius shot 3/12, with MMR and hep A/B  had pneumovax age 47  NL CXR 05/30/11   . History of colonoscopy 2007    MGM colon cancer 70  nl colonscopy 2007 dr Durwin Nora   . Hyperlipidemia     sees cards dr. Neomia Dear  9/12 chol 274 trigs 225 H53/L176 had myositis with statins,niacin-now on zetia had nl carotid duplex/thallium 1/12 Nl thallium-6/13  11/13 chol 260 H63/L173,ck= 305,cmp-nl creat 0.95-Referred to rheum 5/`14 re elevated cpk Saw Dr Neomia Dear 9/14-myalgia gone ,off tricor,but ck still even higher-echo/thallium ordred   . Hypertension    . Osteoporosis      o'penia had been on reclast per gyn and ergocalciferol early 2012. dexa 1/12-T scores -1.1, -0.5   . Routine history and physical examination of adult 02/2010    last Tetanus shot 2012   . Thyroid disease     hypothyroid on synthroid        Past Surgical Hx:     Past Surgical History:   Procedure Laterality Date   . EYE SURGERY  2011   . HYSTERECTOMY      complete laparoscopic hyterectomy,rectocele/cystocele repair 09/12/11 in [place]   . maxillofacial  1981   . NASAL SEPTUM SURGERY          Allergies   Erythromycin; Neosporin [neomycin-bacitracin zn-polymyx]; and Sulfa drugs cross reactors    Meds   HOME :   Prior to Admission medications    Medication Sig Start Date End Date Taking? Authorizing Provider   Alirocumab (PRALUENT) 150 MG/ML Solution Pen-injector Inject into the skin.    [provider]   Cholecalciferol (VITAMIN D) 1000 UNITS capsule Take 1,000 Units by mouth daily.      [provider]   co-enzyme Q-10 30 MG capsule Take 30 mg by mouth 3 (three) times daily.      [provider]   desoximetasone (TOPICORT) 0.05 % cream Apply topically 2 (two) times daily.      [provider]   ezetimibe (ZETIA) 10 MG tablet Take 10 mg by mouth daily.      [provider]   fenofibrate (TRICOR) 145 MG tablet Take 145 mg by mouth daily.      [provider]   FLUoxetine (PROZAC) 10 MG tablet Take 10 mg by mouth daily.    [provider]   FLUoxetine (PROZAC) 20 MG tablet Take 20 mg by mouth daily.      [provider]   fluticasone (FLONASE) 50 MCG/ACT nasal spray INSTILL SPRAY IN NOSTRIL TWICE A DAY 01/16/16   [provider]   gabapentin (NEURONTIN) 300 MG capsule TAKE 1 TABLET AT NIGHT 01/19/16   [provider]  glucosamine-chondroitin 500-400 MG tablet Take 1 tablet by mouth 3 (three) times daily.    [provider]   levothyroxine (SYNTHROID, LEVOTHROID) 75 MCG tablet TAKE 1 TABLET BY MOUTH DAILY 01/02/16    [provider]   Multiple Vitamins-Minerals (MULTIVITAMIN WITH MINERALS) tablet Take 1 tablet by mouth daily.    [provider]   Omega-3 Fatty Acids (OMEGA-3 FISH OIL) 500 MG Cap Take by mouth.    [provider]   White Petrolatum-Mineral Oil (SYSTANE NIGHTTIME) Ointment APPLY 1/4'' STRIP INTO BOTH EYES ONCE OR TWICE A DAY 11/18/15   [provider]          Social Hx     Social History     Social History   . Marital status: Single     Spouse name: N/A   . Number of children: N/A   . Years of education: N/A     Occupational History   . Not on file.     Social History Main Topics   . Smoking status: Never Smoker   . Smokeless tobacco: Never Used   . Alcohol use Yes   . Drug use: Unknown   . Sexual activity: Not on file     Other Topics Concern   . Not on file     Social History Narrative   . No narrative on file       Premorbid functional status: independent    Family Hx       Advanced Directives       Physical Exam:     Vitals:    08/25/16 0101   BP: 158/69   Pulse: 62   Resp: 14   Temp: 98 F (36.7 C)   SpO2: 95%      No intake or output data in the 24 hours ending 08/25/16 0124  Vent Settings:       Line, Drains, Airway:    General Appearance:    Mental status:  Sedation: none                Opens eyes (spont/voice/pain/none): spont      Orientation: AOX4  Language: fluent          Cranial Nerves  Pupils  OS: size/reactivity: 3  mm brisk           OD: size/reactivity: 3  mm brisk              EOM(III/IV/VI):  intact                               Visual field: intact        Corneal(V/VII):         Face(VII): symmetric  Shoulder shrug/head turn(XI): equal         Cough/gag(IX/X):                      Uvula (IX/X):                  Tongue(XII): midline        MOTOR  Upper Extremity   Drift   LEFT(of 5) 5/5         RIGHT(of 5) 5/5        Lower Extremity    LEFT(of 5) 5/5   RIGHT(of 5) 5/5   Pronator drift: none    Sensory: intact  Cerebellar: NT  Reflexes/Tone/Babinki:  NT  Other:  HEENT: normocephalic, atraumatic   CV: RRR, M1M2, no mumurs  PULM: CTAB  GI: +bowel sounds, soft, nontender  Ext: no edema    Labs:   Labs and ekg reviewed    Rads:   Radiological Imaging personally reviewed      Above plans reviewed with [facility] attending Dr. Jackelyn Poling     I have spent 47 minutes in evaluation, review, and implementation of the above plans excluding procedures.      Signed by: Ether Griffins, MPAS, PA-C  Date/Time: 08/25/16 1:24 AM  Neuro Critical Care.   Attending MD [phone] (24hr) or [phone]  Midlevel Provider [phone] or [phone]

## 2016-08-25 NOTE — Progress Notes (Signed)
Date: 08/25/2016 Time: 12:15 AM    Stroke team notified: 2330 on 08/24/16    65 y.o. right-handed female    Last known well: 2030 on 08/24/16  Cardiovascular risk factors: Hypertension (HBP) and Hyperlipidemia (High Chol)  Prior stroke: none  Pre-stroke mRS: 0 = No symptoms at all  Patient on anticoagulation: No  Initial NIH Stroke Score: 0  Dysphagia screen was performed and patient is able  to tolerate water.  CT without contrast was performed, result showed, Left posterior paranasal area of the contusions and / or subarachnoid bleed measuring in conglomerate about 1.8 X 0.8 cm. No midline shift.  Pt is  a candidate for IV-tPA since NIH=0 and pt has a bleed..   Stroke education started. Plan of care discussed with patient amenable to it.  Findings and plan discussed with emergency MD and RN. Will follow as appropriate.      tPA bolus time:n/a  tPA infusion start time:n/a  Patient/caregiver educated on possible side effects/adverse reactions of IV-tPA and provided with handout.        Stroke Team RN Signature: Huntley Demedeiros  Tejan-Kamara        Modified Rankin Scale:  0- No symptoms at all   1- No significant disability despite symptoms; able to carry out all usual duties and activities   2- Slight disability; unable to carry out all previous activities, but able to look after own affairs without assistance   3- Moderate disability; requiring some help, but able to walk without assistance   4- Moderately severe disability; unable to walk without assistance and unable to attend to own bodily needs without assistance    5- Severe disability; bedridden, incontinent and requiring constant nursing care and attention

## 2016-08-25 NOTE — ED Notes (Signed)
Bed: S 16  Expected date:   Expected time:   Means of arrival:   Comments:  Alex Tr > [place] - Head Bleed

## 2016-08-25 NOTE — Progress Notes (Signed)
[facility]- Neuro Critical Care Service [facility])                                                         Progress  Note        Date Time: 08/25/16 9:57 AM  Patient Name: Tracy [name],Tracy [name]  Attending Physician: Frederik Pear, MD  Room: [phone]  Admit Date: 08/25/2016  LOS: 0 days      Chief Complaint   Patient presents with   . Intracranial Hemorrhage     Presentation:   66 yo F transferred from OSH for mgmt of sah on 08/24/2016. The patient reports she was eating dinner this evening and she felt like a piece of food "went down the wrong pipe"/ got stuck and she needed to cough it up. On her second cough/retch she experience a HA that started in the front of the right side of her head and moved down the right towards her neck. She says the HA was the worst HA of her live and was 10/10.  She took 2 extra strength tylenol. She also had mild blurry vision of her right peripheral vision and mild +photophobia that persists. She also noticed some weakness/tremors in her left arm and leg while being examined at OSH. She denies use of NSAIDs or AC, but does take Celebrex for hip arthritis. She reports minor HA at this time, it has improved with pain medicine, she denies N/V, numbness/tingling, blurry or double vision, CP, or SOB .  7/6: Sensation of food being stuck in her esophagus with eating.    ASSESSMENT               PLAN  Left posterior parafalcine convexity SAH  Achalasia?     PMH:   Hypothyroidism  HTN  HLD  Restless leg syndrome  OA  depression    Baseline MRS NEURO:   Appreciate Nsurg consult  Prozac  Neurontin    -Goals:   Perfusion goals: 100-150  Na+ goal: 135-145  Pain/Sedation: PRN fentanyl and oxycodone for HA  PT/OT/Speech/swallow eval- PT/OT    CV: HD stable, no history of HTN   - HLD- unable to take statins due to hepatic side effects and myalgias.     PULM: protecting airway.     GI: dysphagia of unclear etiology - GI consulted  GI PPX: d/c pepcid  Nutrition: NPO for possible EGD per  GI    RENAL: normal renal function  I/O Goal: even  D/c mIVF    ID: NAI     HEME: H/H stable     SCD  Pharmacologic ppx: consider starting tomorrow     ENDO/RHEUM: maintain euglycemia  Glucose: 140-180  Synthroid    Lines/Foley: PIV, no foley  Daily labs: Y    Family Meeting:   Patient is currently able to make medical decisions. Patients proxy is unknown and was not present  Discussed current diagnoses, prognosis and goals of care.   Code status was also discussed and is full .   Code status Full Code    DISPO: neuro floor      Physical Exam:     Vitals:    08/25/16 0800   BP: 148/70   Pulse: 67   Resp: 20   Temp: 97.4 F (36.3 C)   SpO2:  97%        General Appearance:    Mental status:  Sedation: none                Opens eyes (spont/voice/pain/none): spont      Orientation: AOX4  Language: fluent          Cranial Nerves  Pupils  OS: size/reactivity: 3  mm brisk           OD: size/reactivity: 3  mm brisk              EOM(III/IV/VI):  intact                               Visual field: intact        Corneal(V/VII):         Face(VII): symmetric  Shoulder shrug/head turn(XI): equal         Cough/gag(IX/X):                      Uvula (IX/X):                  Tongue(XII): midline        MOTOR  Upper Extremity   Drift   LEFT(of 5) 5/5         RIGHT(of 5) 5/5        Lower Extremity    LEFT(of 5) 5/5   RIGHT(of 5) 5/5   Pronator drift: none    Sensory: intact  Cerebellar: NT  Reflexes/Tone/Babinki: NT  Other:     HEENT: normocephalic, atraumatic   CV: RRR, M1M2, no mumurs  PULM: CTAB  GI: +bowel sounds, soft, nontender  Ext: no edema    Labs:   Labs and ekg reviewed    Rads:   Radiological Imaging personally reviewed    I have spent 37 minutes in evaluation, review, and implementation of the above plans excluding procedures.      Signed by: Frederik Pear, MD  Date/Time: 08/25/16 9:57 AM  Neuro Critical Care.   Attending MD [phone] (24hr) or [phone]  Midlevel Provider [phone] or [phone]

## 2016-08-25 NOTE — Plan of Care (Signed)
Problem: Safety  Goal: Patient will be free from injury during hospitalization  Outcome: Progressing   08/25/16 1621   Goal/Interventions addressed this shift   Patient will be free from injury during hospitalization  Assess patient's risk for falls and implement fall prevention plan of care per policy;Provide and maintain safe environment;Use appropriate transfer methods;Include patient/ family/ care giver in decisions related to safety;Hourly rounding   Floor mats in place. Pt calls for assistance. Ambulates with a steady gait.     Problem: Pain  Goal: Pain at adequate level as identified by patient  Outcome: Progressing   08/25/16 1621   Goal/Interventions addressed this shift   Pain at adequate level as identified by patient Identify patient comfort function goal;Assess for risk of opioid induced respiratory depression, including snoring/sleep apnea. Alert healthcare team of risk factors identified.;Assess pain on admission, during daily assessment and/or before any "as needed" intervention(s);Reassess pain within 30-60 minutes of any procedure/intervention, per Pain Assessment, Intervention, Reassessment (AIR) Cycle;Evaluate if patient comfort function goal is met;Evaluate patient's satisfaction with pain management progress   Pt c/o 7/10 pain this am, prn fentanyl given with good results. Rest of day pt states pain in 1/10.    Problem: Neurological Deficit  Goal: Neurological status is stable or improving  Outcome: Progressing   08/25/16 1621   Goal/Interventions addressed this shift   Neurological status is stable or improving Monitor/assess/document neurological assessment (Stroke: every 4 hours);Perform CAM Assessment   Pt A&Ox4. MAE 5/5. Ambulates with steady gait. F/c.

## 2016-08-25 NOTE — Plan of Care (Signed)
Problem: Pain  Goal: Pain at adequate level as identified by patient  Outcome: Progressing   08/25/16 1621   Goal/Interventions addressed this shift   Pain at adequate level as identified by patient Identify patient comfort function goal;Assess for risk of opioid induced respiratory depression, including snoring/sleep apnea. Alert healthcare team of risk factors identified.;Assess pain on admission, during daily assessment and/or before any "as needed" intervention(s);Reassess pain within 30-60 minutes of any procedure/intervention, per Pain Assessment, Intervention, Reassessment (AIR) Cycle;Evaluate if patient comfort function goal is met;Evaluate patient's satisfaction with pain management progress       Problem: Neurological Deficit  Goal: Neurological status is stable or improving  Outcome: Progressing   08/25/16 1945   Goal/Interventions addressed this shift   Neurological status is stable or improving Monitor/assess/document neurological assessment (Stroke: every 4 hours);Observe for seizure activity and initiate seizure precautions if indicated;Perform CAM Assessment     NURSING PROGRESS NOTE    Patient Name: Tracy [name] (66 y.o. female)  Admission Date: 08/25/2016 [facility] Day 0)    Shift Note: Pt transferred from NSICU, oriented to room/unit, and policies. Fall contract explained and signed.   Pt AOx4, FC, MAE, VSS. C/o mild HA when ambulating. SBG when OOB, steady gait. Pt NPO for EGD or barrium swallow. Safety precautions maintained.      Recent Labs  Lab 08/25/16  0328   Sodium 139   Potassium 4.1   Chloride 109   CO2 20*   BUN 21.0*   Creatinine 0.7   EGFR >60.0   Glucose 102*   Calcium 9.1         Recent Labs  Lab 08/25/16  0328   WBC 7.81   Hgb 12.4   Hematocrit 36.6*   Platelets 271         Patient Lines/Drains/Airways Status    Active Lines, Drains and Airways     Name:   Placement date:   Placement time:   Site:   Days:    Peripheral IV 08/24/16 Left Antecubital  08/24/16        Antecubital     1    Peripheral IV 08/25/16 Left Forearm  08/25/16    0400    Forearm    less than 1                Pending Orders: NPO for EGD  Discharge Plan: TBD   Safety Checklist    Fall Precautions Y    Avasys N    Seizure Precautions N    Aspiration Precautions N

## 2016-08-25 NOTE — UM Notes (Signed)
08/25/16 0130  Admit to Inpatient (ADULT INPATIENT ADMIT PANEL )        UTILIZATION REVIEW CONTACT: Name: Josie Correll Denbow  Clinical Case Manager  - Utilization Review  [facility]  Address:  [address] [place], [place]  [address]  NPI:   [id number]  Tax ID:  [id number]  Phone: [phone]  Fax: [phone]    Please use fax number [phone] to provide authorization for hospital services or to request additional information.        PATIENT NAME: Tracy [name],Tracy [name]   DOB: 05/14/1950   PMH:  has a past medical history of Depression; Health care maintenance; History of colonoscopy (2007); Hyperlipidemia; Hypertension; Osteoporosis; Routine history and physical examination of adult (02/2010); and Thyroid disease.  PSH:  has a past surgical history that includes Nasal septum surgery; maxillofacial (1981); Eye surgery (2011); and Hysterectomy.     ED TREATMENT      ADMISSION REVIEW   History of present illness: Pt is a 65 y.o. female who arrived to the ER (08/25/2016 at 0100) with C/OIntracranial Hemorrhage    66 yo F transferred from OSH for mgmt of sah on 08/24/2016. The patient reports she was eating dinner this evening and she felt like a piece of food "went down the wrong pipe"/ got stuck and she needed to cough it up. On her second cough/retch she experience a HA that started in the front of the right side of her head and moved down the right towards her neck. She says the HA was the worst HA of her live and was 10/10.  She took 2 extra strength tylenol. She also had mild blurry vision of her right peripheral vision and mild +photophobia that persists. She also noticed some weakness/tremors in her left arm and leg while being examined at OSH. She denies use of NSAIDs or AC, but does take Celebrex for hip arthritis. She reports minor HA at this time, it has improved with pain medicine, she denies N/V, numbness/tingling, blurry or double vision, CP, or SOB .    VS: 97.9, 76, 20, 99% RA,  127/60    Abnormal Labs: H/H 12.4/36.6, GLUCOSE 102, BUN/CREAT 21.0/0.7, CO2 20, TRIGLYCERIDES 816, TROP 0.11    Diagnostics: MRI of BRAIN without contrast-  FINDINGS:  There is again a small focal area of subarachnoid hemorrhage about the  left parietal paramedian region, without appreciable change. Underlying  brain parenchyma appear within normal limit, without evidence of  abnormal enhancement. No evidence of a dural venous sinus thrombosis.     There is incidental cisterna magna of the posterior fossa.  The brain parenchyma is unremarkable for age  .  The diffusion weighted images are normal without acute ischemia.    The ventricles and the basal cisterns are normal without mass effect.    The paranasal sinuses are normal.       Impression:      1. Small focal area of subarachnoid hemorrhage about the left  paramedian parietal lobe, unchanged.       Medications in ED:   ondansetron [facility]) injection 4 mg Given x1   4 mg, IV, Q6H PRN    07/06 0635     Plasma-Lyte A IV infusion  Rate: 75 mL/hr Freq: Continuous Route: IV  Start: 08/25/16 0136 End: 08/25/16 1009    Assessment:   Mental status:  Sedation: none                Opens eyes (spont/voice/pain/none):  spont      Orientation: AOX4  Language: fluent          Cranial Nerves  Pupils  OS: size/reactivity: 3  mm brisk             OD: size/reactivity: 3  mm brisk              EOM(III/IV/VI):  intact                                       Visual field: intact        Corneal(V/VII):         Face(VII): symmetric  Shoulder shrug/head turn(XI): equal         Cough/gag(IX/X):                      Uvula (IX/X):                  Tongue(XII): midline        MOTOR  Upper Extremity   Drift   LEFT(of 5) 5/5         RIGHT(of 5) 5/5        Lower Extremity    LEFT(of 5) 5/5   RIGHT(of 5) 5/5   Pronator drift: none    Sensory: intact  Cerebellar: NT  Reflexes/Tone/Babinki: NT  Other:     HEENT: normocephalic, atraumatic   CV: RRR, M1M2, no mumurs  PULM:  CTAB  GI: +bowel sounds, soft, nontender  Ext: no edema      Plan:   NEURO: Admit to NSICU   Q2h neuro exams   MRI W WO   MRV and MRA   Consider calcium channel blocker for anti-hypertensives if necessary     -Goals:   Perfusion goals: 100-150  Na+ goal: 135-145  Pain/Sedation: PRN fentanyl and oxycodone for HA  Drains: none  PT/OT/Speech/swallow eval- PT/OT    CV: HD stable, no history of HTN   - PRN labetalol/hydralazine per goals  - HLD- unable to take statins due to hepatic side effects and myalgias.     PULM: protecting airway.     GI: NAI  GI PPX: pepcid  Nutrition: advance diet    RENAL: normal renal function  I/O Goal:     YN:WGNFAOZH, no leukocytosis     HEME: H/H stable     SCD  Pharmacologic ppx: not indicated due to ICH     ENDO/RHEUM: maintain euglycemia  Glucose: 140-180    Lines/Foley:PIV, no foley  Daily labs: Y    Family Meeting:   Patient is currently able to make medical decisions. Patients proxy is unknown and was not present  Discussed current diagnoses, prognosis and goals of care.   Code status was also discussed and is full .      DAY 2 Progress Note:    VS: 97.4, 67, 20, 148/70, 97% RA     Neurosurgery consulted on case.   ASSESSMENT:   Heart: RRR  Lungs: CTA  Abdomen: SNT    Awake alert oriented x3   GCS: 15 E: 4 V: 5 M: 6  Speech is clear and fluent   PERRL 4 mm EOM: intact, face: symmetrical, Tongue: midline   Cranial nerves:   -CN II: Visual fields full to bedside confrontation   -CN III, IV, VI: Pupils equal, round, and reactive to light; extraocular movements intact; no ptosis  -  CN V: Facial sensation intact in V1 through V3 distributions   -CN VII: Face symmetric   -CN VIII: Hearing intact to conversational speech   -CN IX, X: Palate elevates symmetrically; normal phonation   -CN XI: Symmetric full strength of sternocleidomastoid and trapezius muscles   -CN XII: Tongue protrudes midline  Strength/Motor function:   LUE  Delt: 5/5 Bi: 5/5 Tri:  5/5 Wfl: 5/5 Wex: 5/5 Grip: 5/5  RUE  Delt: 5/5 Bi: 5/5 Tri: 5/5 Wfl: 5/5 Wex: 5/5 Grip: 5/5  LLE  Psoas: 5/5 Quad: 5/5 Lfl: 5/5 Lex: 5/5 ADF: 5/5 APF: 5/5 EHL: 5/5  RLE  Psoas: 5/5 Quad: 5/5 Lfl: 5/5 Lex: 5/5 ADF: 5/5 APF: 5/5 EHL: 5/5  Sensation to light touch: intact  Pronator drift: absent  Hoffmann's: negative  Clonus: negative  Babinski: negative    PLAN per neurosurgery: Admit to NSICU under [facility]  Medical management per [facility]S  Systolic BP goal <161  Repeat HCT for neuro decline  INR goal <1.3, reverse prn  Na+ goal 140-150  No anticoagulation/antiplatelets/NSAIDs  No acute Neurosurgical intervention indicated at this time.    Plan per ICU:   NEURO:   Appreciate Nsurg consult  Prozac  Neurontin  Vitals Q1H.     -Goals:   Perfusion goals: 100-150  Na+ goal: 135-145  Pain/Sedation: PRN fentanyl and oxycodone for HA  PT/OT/Speech/swallow eval- PT/OT    CV: HD stable, no history of HTN   - HLD- unable to take statins due to hepatic side effects and myalgias.     PULM: protecting airway.     GI: dysphagia of unclear etiology - GI consulted  GI PPX: d/c pepcid  Nutrition: NPO for possible EGD per GI    RENAL: normal renal function  I/O Goal: even  D/c mIVF    ID: NAI     HEME: H/H stable     SCD  Pharmacologic ppx: consider starting tomorrow     ENDO/RHEUM: maintain euglycemia  Glucose: 140-180  Synthroid    Lines/Foley: PIV, no foley  Daily labs: Y    Family Meeting:   Patient is currently able to make medical decisions. Patients proxy is unknown and was not present  Discussed current diagnoses, prognosis and goals of care.   Code status was also discussed and is full.     Randa Lynn, RN, BSN  UR Case Manager   [facility]  [address]  [place], [place] [address]  T [phone]        NOTES TO REVIEWER:    This clinical review is based on/compiled from documentation provided by the treatment team within the patient's medical record.

## 2016-08-25 NOTE — Consults (Signed)
NEUROSURGERY CONSULTATION    Date Time: 08/25/16 1:26 AM  Patient Name: Tracy [name],Tracy [name]  Requesting Physician: Leanora Ivanoff, MD  Consulting Physician: Dr. Nicoletta Dress    Time of Exam: 0130     Reason for Consultation:   Left posterior parafalcine intracranial hemorrhage    History:   Satara Virella is a 66 y.o. right handed female who presents to the hospital on 08/25/2016 with a posterior parafalcine intracranial hemorrhage. Patient had a sudden onset headache tonight when she coughed, also with some visual blurriness in her right lateral vision field and photophobia, with associated nausea and vomiting x2, difficulty swallowing and left arm tremor. Patient was initially evaluated at [facility], where HCT showed the hemorrhage as noted, prompting transfer to [facility] and Neurosurgical consult. Patient denies dizziness, weakness, numbness/tingling, neck/back pain, chest pain, abdominal pain, shortness of breath.     Past Medical History:     Past Medical History:   Diagnosis Date   . Depression     on prozac many yrs stable sees therapist   . Health care maintenance     colonscopy 1/08-5 yr f/u dr Durwin Nora  pap-1/12  mammo-, dexa-1/12,mammo 3/13  tetanius shot 3/12, with MMR and hep A/B  had pneumovax age 37  NL CXR 05/30/11   . History of colonoscopy 2007    MGM colon cancer 70  nl colonscopy 2007 dr Durwin Nora   . Hyperlipidemia     sees cards dr. Neomia Dear  9/12 chol 274 trigs 225 H53/L176 had myositis with statins,niacin-now on zetia had nl carotid duplex/thallium 1/12 Nl thallium-6/13  11/13 chol 260 H63/L173,ck= 305,cmp-nl creat 0.95-Referred to rheum 5/`14 re elevated cpk Saw Dr Neomia Dear 9/14-myalgia gone ,off tricor,but ck still even higher-echo/thallium ordred   . Hypertension    . Osteoporosis     o'penia had been on reclast per gyn and ergocalciferol early 2012. dexa 1/12-T scores -1.1, -0.5   . Routine history and physical examination of adult 02/2010    last Tetanus shot 2012   . Thyroid disease     hypothyroid  on synthroid       Past Surgical History:     Past Surgical History:   Procedure Laterality Date   . EYE SURGERY  2011   . HYSTERECTOMY      complete laparoscopic hyterectomy,rectocele/cystocele repair 09/12/11 in [place]   . maxillofacial  1981   . NASAL SEPTUM SURGERY         Family History:     Family History   Problem Relation Age of Onset   . Osteoporosis Mother    . COPD Mother    . Diabetes Mother    . Arthritis Mother    . Cancer Father    . Cancer Paternal Grandfather        Social History:     Social History     Social History   . Marital status: Single     Spouse name: N/A   . Number of children: N/A   . Years of education: N/A     Social History Main Topics   . Smoking status: Never Smoker   . Smokeless tobacco: Never Used   . Alcohol use Yes   . Drug use: Unknown   . Sexual activity: Not on file     Other Topics Concern   . Not on file     Social History Narrative   . No narrative on file       Allergies:  Allergies   Allergen Reactions   . Erythromycin    . Neosporin [Neomycin-Bacitracin Zn-Polymyx]    . Sulfa Drugs Cross Reactors        Medications:       Review of Systems:   A comprehensive review of systems was: Negative except as noted in HPI. All other systems were reviewed and are negative    Physical Exam:     Vitals:    08/25/16 0101   BP: 158/69   Pulse: 62   Resp: 14   Temp: 98 F (36.7 C)   SpO2: 95%       Intake and Output Summary (Last 24 hours) at Date Time  No intake or output data in the 24 hours ending 08/25/16 0126  General Appearance: well appearing adult female, no obvious acute distress    Physical Exam:  Heart: RRR  Lungs: CTA  Abdomen: SNT    Awake alert oriented x3   GCS: 15 E: 4 V: 5 M: 6  Speech is clear and fluent   PERRL 4 mm EOM: intact, face: symmetrical, Tongue: midline   Cranial nerves:   -CN II: Visual fields full to bedside confrontation   -CN III, IV, VI: Pupils equal, round, and reactive to light; extraocular movements intact; no ptosis                                  -CN V: Facial sensation intact in V1 through V3 distributions   -CN VII: Face symmetric   -CN VIII: Hearing intact to conversational speech   -CN IX, X: Palate elevates symmetrically; normal phonation   -CN XI: Symmetric full strength of sternocleidomastoid and trapezius muscles   -CN XII: Tongue protrudes midline  Strength/Motor function:   LUE  Delt: 5/5 Bi: 5/5 Tri: 5/5 Wfl: 5/5 Wex: 5/5 Grip: 5/5  RUE  Delt: 5/5 Bi: 5/5 Tri: 5/5 Wfl: 5/5 Wex: 5/5 Grip: 5/5  LLE  Psoas: 5/5 Quad: 5/5 Lfl: 5/5 Lex: 5/5 ADF: 5/5 APF: 5/5 EHL: 5/5  RLE  Psoas: 5/5 Quad: 5/5 Lfl: 5/5 Lex: 5/5 ADF: 5/5 APF: 5/5 EHL: 5/5  Sensation to light touch: intact  Pronator drift: absent  Hoffmann's: negative  Clonus: negative  Babinski: negative     Labs Reviewed:     Lab Results   Component Value Date    WBC 8.76 08/24/2016    HGB 13.6 08/24/2016    HCT 40.3 08/24/2016    MCV 87.0 08/24/2016    PLT 281 08/24/2016     Lab Results   Component Value Date    NA 142 08/24/2016    K 3.9 08/24/2016    CL 109 08/24/2016    CO2 25 08/24/2016     Lab Results   Component Value Date    INR 0.9 08/24/2016    PT 12.6 08/24/2016       Rads:     CT Head without Contrast [[id number]] (Order [id number])   Status: Final result   Study Result     CT Head without contrast         Indication:    Right-sided headache, blurred vision on right side.  Asymmetric smile.    Comparison:  None available    Technique:  Axial CT images were obtained through the brain from the  skull base to the vertex without administration of IV contrast.  A  combination of automatic exposure control, adjustment of  the mA and/or  kV  according to patient size and/or use of iterative reconstruction  technique was   utilized.    Findings:     There are small areas of high density measuring altogether approximately  1.8 x 0.8 cm at the left paramedian convexity posteriorly consistent  with the parenchymal contusions and or subarachnoid bleed. No midline  shift or mass effect. No acute  territorial ischemia, midline shift or  mass effect. Ventricles and sulci are within normal limits for patient's  age. Gray-white matter differentiation is maintained.       The included paranasal sinuses and mastoid air cells are clear.    No displaced skull fracture.    IMPRESSION:      1. Left posterior paramedian area of the contusions and/or subarachnoid  bleed measuring in conglomerate about 1.8 x 0.8 cm. No midline shift.    Discussed with Dr. Roxan Hockey at 11:09 PM.    Disclaimer: CT sensitivity for detection of acute ischemia is limited,  and if there is high index of suspicion for infarct, MRI with  diffusion-weighted imaging should be obtained.    [place]  Teferra, MD   08/24/2016 11:15 PM         Assessment:   66 year old female with posterior parafalcine intracranial hemorrhage    Plan:   Admit to NSICU under [facility]  Medical management per [facility]S  Systolic BP goal <098  MRI/MRA w/wo contrast  Repeat HCT for neuro decline  INR goal <1.3, reverse prn  Na+ goal 140-150  No anticoagulation/antiplatelets/NSAIDs  No acute Neurosurgical intervention indicated at this time    Plan discussed with and developed by Dr. Claudette Laws    Signed by: Cecilio Asper PA-C  Date/Time: 08/25/16 1:26 AM    Neurosurgery Attending  Seen and examined myself; no deficits and vision now full  films personally reviewed: abnormal left parietal parasagittal region  Agree with the Physician Assistant note above.    Needs further work up    Signed by: Brunetta Genera  Date/Time: 08/25/16 8:18 AM  Cerebrum MD  [phone]

## 2016-08-26 ENCOUNTER — Inpatient Hospital Stay: Payer: BLUE CROSS/BLUE SHIELD

## 2016-08-26 ENCOUNTER — Encounter: Payer: Self-pay | Admitting: Hospitalist

## 2016-08-26 LAB — CBC
Absolute NRBC: 0 10*3/uL
Hematocrit: 41.2 % (ref 37.0–47.0)
Hgb: 13.8 g/dL (ref 12.0–16.0)
MCH: 28.5 pg (ref 28.0–32.0)
MCHC: 33.5 g/dL (ref 32.0–36.0)
MCV: 84.9 fL (ref 80.0–100.0)
MPV: 9.2 fL — ABNORMAL LOW (ref 9.4–12.3)
Nucleated RBC: 0 /100 WBC (ref 0.0–1.0)
Platelets: 272 10*3/uL (ref 140–400)
RBC: 4.85 10*6/uL (ref 4.20–5.40)
RDW: 13 % (ref 12–15)
WBC: 7.91 10*3/uL (ref 3.50–10.80)

## 2016-08-26 LAB — TROPONIN I: Troponin I: 0.01 ng/mL (ref 0.00–0.09)

## 2016-08-26 LAB — BASIC METABOLIC PANEL
BUN: 16 mg/dL (ref 7.0–19.0)
CO2: 23 mEq/L (ref 22–29)
Calcium: 8.6 mg/dL (ref 8.5–10.5)
Chloride: 107 mEq/L (ref 100–111)
Creatinine: 0.8 mg/dL (ref 0.6–1.0)
Glucose: 86 mg/dL (ref 70–100)
Potassium: 4.4 mEq/L (ref 3.5–5.1)
Sodium: 140 mEq/L (ref 136–145)

## 2016-08-26 LAB — GFR: EGFR: >60

## 2016-08-26 MED ORDER — FLUOXETINE HCL 10 MG PO CAPS
30.0000 mg | ORAL_CAPSULE | Freq: Every day | ORAL | Status: DC
Start: 2016-08-26 — End: 2016-08-28
  Administered 2016-08-26 – 2016-08-28 (×3): 30 mg via ORAL
  Filled 2016-08-26 (×3): qty 3

## 2016-08-26 MED ORDER — CELECOXIB 200 MG PO CAPS
200.0000 mg | ORAL_CAPSULE | Freq: Every day | ORAL | Status: DC
Start: 2016-08-26 — End: 2016-08-28
  Administered 2016-08-26 – 2016-08-28 (×3): 200 mg via ORAL
  Filled 2016-08-26 (×4): qty 1

## 2016-08-26 MED ORDER — FLUTICASONE PROPIONATE 50 MCG/ACT NA SUSP
1.0000 | Freq: Every day | NASAL | Status: DC
Start: 2016-08-26 — End: 2016-08-28
  Administered 2016-08-26 – 2016-08-28 (×3): 1 via NASAL
  Filled 2016-08-26: qty 16

## 2016-08-26 MED ORDER — PRAMIPEXOLE DIHYDROCHLORIDE 0.25 MG PO TABS
0.2500 mg | ORAL_TABLET | Freq: Every evening | ORAL | Status: DC
Start: 2016-08-26 — End: 2016-08-28
  Administered 2016-08-26 – 2016-08-27 (×3): 0.25 mg via ORAL
  Filled 2016-08-26 (×4): qty 1

## 2016-08-26 MED ORDER — FLUOXETINE HCL 10 MG PO CAPS
30.0000 mg | ORAL_CAPSULE | Freq: Every day | ORAL | Status: DC
Start: 2016-08-26 — End: 2016-08-26

## 2016-08-26 NOTE — SLP Progress Note (Signed)
[facility]   Speech Therapy Cancellation Note      Patient:  Tracy [name] MRN#:  [id number]  Unit:  [facility] TOWER 7 Room/Bed:  X528/U132.44    08/26/2016  Time: 09:01      Patient not seen for speech therapy secondary to pt is leaving floor for an esophagram, per RN. SLP will f/u at a later time as able/appropriate. -- Mary Sella M.A. CCC-SLP #[id number], 08/26/2016

## 2016-08-26 NOTE — SLP Eval Note (Signed)
[facility]   Speech and Language Therapy Bedside Swallow Evaluation     Patient: Tracy [name]    MRN#: [id number]     Consult received for Latrelle Dodrill Derousse for SLP Bedside Swallow Evaluation and Treatment.    Plan/Recommendations:   Diet Solid Recommendations: regular (pending clearance by medical team given dysmotility)   Liquid Recommendations:  no liquid consistency restrictions (pending clearance by medical team given dysmotility)  Administration of Medications: PO, whole, with liquid (pending clearnace by medicl team given dysmotility)  Precautions/Compensations: Awake/alert;Upright 90 degrees for all oral intake;45 degrees upright after meals  Aspiration Precautions posted at bedside: n/a        Recommendation Discussed With: : Patient;Physician       SLP Frequency Recommended: 2x per week    Discharge recommendations: Home with supervision/assistance    Assessment:   Patient with h/o coughing/choking on regurgitated material, presents at bedside this date with adequate oral and suspect pharyngeal swallow function.  Suspect pharyngoesophageal dysphagia in setting of dysmotility and backflow of material through UES.  Esophagram report with no mention of aspiration of backflow material, but gastroesophageal  reflux is mentioned.  From swallow standpoint at this time, recommend regular solids with thin liquids if cleared by GI and attending physicians. SLP will f/u x 1 for tolerance. No clinical s/s aspiration noted this date.  Please d/c all PO and make NPO in EPIC with any s/s aspiration.     Images from esophagram reviewed by this Clinical research associate.  Noted penetration (suspect flash) on series 13.      History of Present Illness:   Tracy [name] is a 66 y.o. female transferred from OSH on  08/25/2016 for mgmt of sah on 08/24/2016.  Neuro: Left posterior parafalcine convexity SAH. Etiology nontraumatic, likley occurred during vigorous coughing/choking episode.   MRI brain unchanged and MRV head  negative.      Esophagram:  IMPRESSION:   1. 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  2. Mild dilatation of the mid esophagus.  3. Mild gastroesophageal reflux.  Prince Solian, MD   08/26/2016 10:25 AM        Medical Diagnosis: SAH (subarachnoid hemorrhage) [I60.9]  SDH (subdural hematoma) [I62.00]  Subarachnoid hemorrhage [I60.9]  SDH (subdural hematoma) [I62.00]    Therapy Diagnosis: adequate oropharyngeal swallow function. Suspect pharyngoesophageal dysphagia     Past Medical/Surgical History:   Past Medical History:   Diagnosis Date   . Depression     on prozac many yrs stable sees therapist   . Health care maintenance     colonscopy 1/08-5 yr f/u dr Durwin Nora  pap-1/12  mammo-, dexa-1/12,mammo 3/13  tetanius shot 3/12, with MMR and hep A/B  had pneumovax age 51  NL CXR 05/30/11   . History of colonoscopy 2007    MGM colon cancer 70  nl colonscopy 2007 dr Durwin Nora   . Hyperlipidemia     sees cards dr. Neomia Dear  9/12 chol 274 trigs 225 H53/L176 had myositis with statins,niacin-now on zetia had nl carotid duplex/thallium 1/12 Nl thallium-6/13  11/13 chol 260 H63/L173,ck= 305,cmp-nl creat 0.95-Referred to rheum 5/`14 re elevated cpk Saw Dr Neomia Dear 9/14-myalgia gone ,off tricor,but ck still even higher-echo/thallium ordred   . Hypertension    . Osteoporosis     o'penia had been on reclast per gyn and ergocalciferol early 2012. dexa 1/12-T scores -1.1, -0.5   . Restless legs syndrome    . Routine history and physical examination of adult 02/2010  last Tetanus shot 2012   . Thyroid disease     hypothyroid on synthroid     Past Surgical History:   Procedure Laterality Date   . EYE SURGERY  2011   . HYSTERECTOMY      complete laparoscopic hyterectomy,rectocele/cystocele repair 09/12/11 in [place]   . maxillofacial  1981   . NASAL SEPTUM SURGERY       History/Current Status:  Respiratory Status: room air  Behavior/Mental Status: Awake/alert;Able to follow directions;Cooperative  Nutrition:  oral  Diet Prior to Study: dysphagia pureed    Subjective:   Patient is agreeable to participation in the therapy session. Nursing clears patient for therapy. Patient's medical condition is appropriate for Speech therapy intervention at this time.     PAIN:  No c/o pain when asked.     Objective:   Observation of Patient/Vital Signs:  Patient is in bed with SCD's in place. periph IV in place.     Patient left with call bell within reach, all needs met, SCDs in place , fall mat in place, bed alarm activated and all questions answered. RN notified of session outcome and patient response.     Oral Motor Skills:  Engineer, maintenance (IT) Skills: within functional limits    Deglutition Skills:  Deglutition Skills  Position: upright 90 degrees  Food(s) Tested: ice chips;thin liquid;puree;solid  Oral Stage: adequate  Pharyngeal Stage: adequate (physician reports witnessed coughing with PO, patient reports choking on potatoes )  Esophageal Stage: s/s of esophageal disorder noted (comment) (see esophagram)      Goals:  Once cleared by GI/medical team:  Pt will tolerate regular solids with thin liquids with no clinical s/s aspiration x 24-48 hours.        Lyndee Leo, MA, CCC-SLP    Pager [phone]      Time of Treatment:  SLP Received On: 08/26/16  Start Time: 1630  Stop Time: 1650  Time Calculation (min): 20 min

## 2016-08-26 NOTE — OT Eval Note (Signed)
[facility]   Occupational Therapy Evaluation/Discharge    Patient: Tracy [name]    MRN#: [id number]   Unit: [facility] TOWER 7  Bed: F745/F745.01                                     Discharge Recommendations:   Discharge Recommendation: Home with no needs   DME Recommended for Discharge:  (none)        Assessment:   Mayar Whittier is a 66 y.o. female admitted 08/25/2016.  Pt presents at/near functional baseline, independent with basic ADLs.  No acute OT needs identified. D/C acute OT services.    Therapy Diagnosis: dec ADLs/transfers    Rehabilitation Potential: good    Treatment Activities: Evaluation only  Educated the patient to role of occupational therapy, plan of care, goals of therapy and safety with mobility and ADLs, home safety.    Plan:   D/C acute OT services     Treatment/Interventions: none, [place] OT    Risks/benefits/POC discussed w/pt       Precautions and Contraindications:   falls    Consult received for Latrelle Dodrill Nevers for OT Evaluation and Treatment.  Patient's medical condition is appropriate for Occupational Therapy intervention at this time.      History of Present Illness:    Vaeda Westall is a 66 y.o. female admitted on 08/25/2016 with non-traumatic SAH after coughing fit from issue related to swallowing    Admitting Diagnosis: SAH (subarachnoid hemorrhage) [I60.9]  SDH (subdural hematoma) [I62.00]  Subarachnoid hemorrhage [I60.9]  SDH (subdural hematoma) [I62.00]    Past Medical/Surgical History:  Past Medical History:   Diagnosis Date   . Depression     on prozac many yrs stable sees therapist   . Health care maintenance     colonscopy 1/08-5 yr f/u dr Durwin Nora  pap-1/12  mammo-, dexa-1/12,mammo 3/13  tetanius shot 3/12, with MMR and hep A/B  had pneumovax age 74  NL CXR 05/30/11   . History of colonoscopy 2007    MGM colon cancer 70  nl colonscopy 2007 dr Durwin Nora   . Hyperlipidemia     sees cards dr. Neomia Dear  9/12 chol 274 trigs 225 H53/L176 had  myositis with statins,niacin-now on zetia had nl carotid duplex/thallium 1/12 Nl thallium-6/13  11/13 chol 260 H63/L173,ck= 305,cmp-nl creat 0.95-Referred to rheum 5/`14 re elevated cpk Saw Dr Neomia Dear 9/14-myalgia gone ,off tricor,but ck still even higher-echo/thallium ordred   . Hypertension    . Osteoporosis     o'penia had been on reclast per gyn and ergocalciferol early 2012. dexa 1/12-T scores -1.1, -0.5   . Restless legs syndrome    . Routine history and physical examination of adult 02/2010    last Tetanus shot 2012   . Thyroid disease     hypothyroid on synthroid         Imaging/Tests/Labs:  Ct Head Without Contrast    Result Date: 08/24/2016   1. Left posterior paramedian area of the contusions and/or subarachnoid bleed measuring in conglomerate about 1.8 x 0.8 cm. No midline shift. Discussed with Dr. Roxan Hockey at 11:09 PM. Disclaimer: CT sensitivity for detection of acute ischemia is limited, and if there is high index of suspicion for infarct, MRI with diffusion-weighted imaging should be obtained. [place]  Teferra, MD 08/24/2016 11:15 PM    Mri Brain W Wo Contrast    Result  Date: 08/25/2016   1.  Small focal area of subarachnoid hemorrhage about the left paramedian parietal  lobe, unchanged.    Einar Pheasant, MD 08/25/2016 8:17 AM    Mrv Head W Wo Contrast    Result Date: 08/25/2016   1.  Negative intracranial MR venogram. 2.  Negative intracranial MR arteriogram.  Einar Pheasant, MD 08/25/2016 8:16 AM        Social History:   Prior Level of Function:  independent  Assistive Devices: none  Baseline Activity: comm ambulation, works FT  DME Currently at Home: none  Home Living Arrangements: alone  Type of Home: house  Home Layout: (+) stairs    Subjective:    Patient is agreeable to participation in the therapy session. Nursing clears patient for therapy.     Patient Goal: get tests done and go home  Pain:   Scale: 4/10  Location: HA  Intervention: meds per RN    Objective:   Patient is in bed with no medical equipment in  place.    Cognitive Status and Neuro Exam:  Aox4, very pleasant and cooperative    Musculoskeletal Examination  Gross ROM: WFL  Gross Strength: WFL      Sensory/Oculomotor Examination  Auditory: WFL  Tactile: WFL  Vision: WFL      Activities of Daily Living  Eating: Independent  Grooming: Independent  Bathing: Independent  UE Dressing: Independent  LE Dressing: Independent  Toileting: Independent    Functional Mobility:  Supine to Sit: Independent  Sit to Stand: Independent  Transfers: Independent, no device.  No LOB.  Demos slight gait issues which are chronic - reports she was receiving OP PT for L hip issues     PMP - Progressive Mobility Protocol   PMP Activity: Step 6 - Walks in Room     Balance  Static Sitting: ind  Dynamic Sitting: ind  Static Standing: ind  Dynamic Standing: adequate for basic ADLs    Participation and Activity Tolerance  Participation Effort: good  Endurance: good    Patient left with call bell within reach, all needs met, SCDs off (none present), fall mat in place, bed alarm off as per arrival, and all questions answered. RN notified of session outcome and patient response.     Goals:                                  Leward Quan, OTR/L 8:46 AM  [id number]            Time of treatment:   OT Received On: 08/26/16  Start Time: 0825  Stop Time: 0841  Time Calculation (min): 16 min

## 2016-08-26 NOTE — Progress Notes (Signed)
NURSING PROGRESS NOTE    Patient Name: Tracy [name] (66 y.o. female)  Admission Date: 08/25/2016 [facility] Day 1)    Shift Note: ANOx4, MAE, follows commands, safety checks performed, NPO for possible EGD or barium swallow, given tylenol once PRN for pain, patient also has home meds for restless leg syndrome and osteoarthritis (pramipexole and celecoxib), added to home med list and notified MD, prozac dose also changed to 30 mg (dose she takes at home)      Recent Labs  Lab 08/25/16  0328   Sodium 139   Potassium 4.1   Chloride 109   CO2 20*   BUN 21.0*   Creatinine 0.7   EGFR >60.0   Glucose 102*   Calcium 9.1         Recent Labs  Lab 08/25/16  0328   WBC 7.81   Hgb 12.4   Hematocrit 36.6*   Platelets 271         Patient Lines/Drains/Airways Status    Active Lines, Drains and Airways     Name:   Placement date:   Placement time:   Site:   Days:    Peripheral IV 08/24/16 Left Antecubital  08/24/16        Antecubital    2    Peripheral IV 08/25/16 Left Forearm  08/25/16    0400    Forearm    less than 1                Last BM:   Pending Orders: EGD/barium swallow  Discharge Plan: TBD    Safety Checklist    Fall Precautions Y    Avasys N    Seizure Precautions N    Aspiration Precautions N

## 2016-08-26 NOTE — PT Eval Note (Signed)
[facility]   Physical Therapy Evaluation/Discharge     Patient: Tracy [name]    MRN#: [id number]   Unit: [facility] TOWER 7  Bed: F745/F745.01    Discharge Recommendations:   Discharge Recommendation: Home with no needs (resume OP PT for rx of her hip pain)       DME Recommendation: none or in place      Assessment:   Tracy [name] is a 66 y.o. female admitted 08/25/2016 with  presents w/ stable  mobility skills.  No acute PT needs identified. D/C acute PT services.   She does display a left trendelenverg gait that is c/w her prior L hip issues for which she was receiving OP PT.  She will need a new script to resume that therapy    Therapy Diagnosis: as above    Rehabilitation Potential: na    Treatment Activities: evaluation ,   Educated the patient to role of physical therapy, plan of care, goals of therapy and HEP, safety with mobility and ADLs.    Plan:   D/C acute PT services      Risks/benefits/POC discussed yes       Precautions and Contraindications:   wbat    Consult received for Tracy [name] for PT Evaluation and Treatment.  Patient's medical condition is appropriate for Physical Therapy intervention at this time.    History of Present Illness:   Tracy [name] is a 66 y.o. female admitted on 08/25/2016 with a posterior parafalcine intracranial hemorrhage. Patient had a sudden onset headache tonight when she coughed, also with some visual blurriness in her right lateral vision field and photophobia, with associated nausea and vomiting x2, difficulty swallowing and left arm tremor. Patient was initially evaluated at [facility], where HCT showed the hemorrhage as noted, prompting transfer to [facility] and Neurosurgical consult.    Medical Diagnosis: SAH (subarachnoid hemorrhage) [I60.9]  SDH (subdural hematoma) [I62.00]  Subarachnoid hemorrhage [I60.9]  SDH (subdural hematoma) [I62.00]    Past Medical/Surgical History:  Past Medical History:   Diagnosis Date   .  Depression     on prozac many yrs stable sees therapist   . Health care maintenance     colonscopy 1/08-5 yr f/u dr Durwin Nora  pap-1/12  mammo-, dexa-1/12,mammo 3/13  tetanius shot 3/12, with MMR and hep A/B  had pneumovax age 76  NL CXR 05/30/11   . History of colonoscopy 2007    MGM colon cancer 70  nl colonscopy 2007 dr Durwin Nora   . Hyperlipidemia     sees cards dr. Neomia Dear  9/12 chol 274 trigs 225 H53/L176 had myositis with statins,niacin-now on zetia had nl carotid duplex/thallium 1/12 Nl thallium-6/13  11/13 chol 260 H63/L173,ck= 305,cmp-nl creat 0.95-Referred to rheum 5/`14 re elevated cpk Saw Dr Neomia Dear 9/14-myalgia gone ,off tricor,but ck still even higher-echo/thallium ordred   . Hypertension    . Osteoporosis     o'penia had been on reclast per gyn and ergocalciferol early 2012. dexa 1/12-T scores -1.1, -0.5   . Restless legs syndrome    . Routine history and physical examination of adult 02/2010    last Tetanus shot 2012   . Thyroid disease     hypothyroid on synthroid     Imaging/Tests/Labs:  MRI Brain W WO Contrast   Impression:      1. Small focal area of subarachnoid hemorrhage about the left  paramedian parietal  lobe, unchanged.  MRV Head W WO Contrast   Impression:  1. Negative intracranial MR venogram.  2.  Negative intracranial MR arteriogram.        Social History:   Prior Level of Function:  Independent without device in home w/ stairs  That is shared w/ roomates  DME Currently at Home: none      Subjective:   Patient is agreeable to participation in the therapy session. Nursing clears patient for therapy.     Patient Goal: home    Pain:   Left hip pain, 4/10, was receiving OP PT    Objective:   Patient received  in bed  with tele, iv access    Cognitive Status and Neuro Exam:  Alert, follows commands    Musculoskeletal Examination  RUE ROM: WFL  LUE ROM: WFL  RLE ROM: WFL  LLE ROM: WFL    RUE Strength: WFL  LUE Strength: WFL  RLE Strength: WFL  LLE Strength: WFL    Slight left pronator drift  fmc-  intact- slower left-- pt right handed  Romberg:  negative    Sensation:  Intact      Functional Mobility  Rolling: independent  Supine to Sit: independent  Scooting: independent  Sit to Stand: independent  Stand to Sit: independent      Ambulation  Stable gait w/out device x 150  Note slight trendelenberg gait (c/w her h/o hip pain    Up/down 4 steps w/ rail  mi    Balance  Static Sitting: good  Dynamic Sitting: good  Static Standing: good  Dynamic Standing: good    Participation and Activity Tolerance  Participation Effort: good  Endurance: good    Patient left with call bell within reach, all needs met and all questions answered, RN notified of session outcome and patient response.     Goals:  na    Time of Treatment  PT Received On: 08/26/16  Start Time: 0725  Stop Time: 0800  Time Calculation (min): 35 min       Delcie Roch, PT;  pgr [phone]

## 2016-08-26 NOTE — Progress Notes (Signed)
[facility] HOSPITALIST PROGRESS NOTE    Date Time: 08/26/16 12:36 PM  Patient Name: Tracy [name],Tracy [name]  Attending Physician: Les Pou, MDMD      Assessment:     Active Hospital Problems    Diagnosis   . SDH (subdural hematoma)   . Subarachnoid hemorrhage   . SAH (subarachnoid hemorrhage)       66 yo F transferred from OSH for mgmt of sah on 08/24/2016. NSGY consulted. MRI brain unchanged and MRV head negative.    Plan:   #Neuro: Left posterior parafalcine convexity SAH. Etiology nontraumatic, likley occurred during vigorous coughing/choking episode.   MRI brain unchanged and MRV head negative.  -NSGY following  -Neuro IR for Angiogram tomorrow. NPO at [facility].  - Continue neuro checks  - Continue tylenol for headache  - PT/OT/SLP    #Dysphagia: Hx of dysphagia for several years.  Barium Esophogram shows evidence of dysmotility.  - GI consulted  - EGD on Monday.  NPO Sunday at [facility].    - SLP evaluation    #Hypothyroidism  - Continue synthroid    #HTN  - SBP goal 100 -150    #HLD  - Unable to take statins due to hepatic side effects and myalgias  - Continue Zetia    #Depression  - Continue Prozac    #Hx of Restless leg syndrome and OA - Noted    DVT prophylaxis: SCDs. Hold Lovenox for now.      Subjective     CC: Subarachnoid hemorrhage    Interval History/24 hour events: NAE. Patient reports HA today when she moves her head or coughs/sneezes.      HPI: per admitting provider: "66 yo F transferred from OSH for mgmt of sah on 08/24/2016. The patient reports she was eating dinner this evening and she felt like a piece of food "went down the wrong pipe"/ got stuck and she needed to cough it up. On her second cough/retch she experience a HA that started in the front of the right side of her head and moved down the right towards her neck. She says the HA was the worst HA of her live and was 10/10.  She took 2 extra strength tylenol. She also had mild blurry vision of her right peripheral vision and mild +photophobia that persists.  She also noticed some weakness/tremors in her left arm and leg while being examined at OSH. She denies use of NSAIDs or AC, but does take Celebrex for hip arthritis. She reports minor HA at this time, it has improved with pain medicine, she denies N/V, numbness/tingling, blurry or double vision, CP, or SOB "    Review of Systems:   General - no pain   Resp - no SOB   CVS - no CP, no CAD   GI - no nausea, no diarrhea   CNS - no visual deficit, no weakness    Physical Exam:     Vitals:    08/25/16 2358 08/26/16 0354 08/26/16 0731 08/26/16 1128   BP: 100/43 96/52 92/46  94/40   Pulse: 63 73 68 68   Resp: 18 18 19 18    Temp: 97.7 F (36.5 C) 98.3 F (36.8 C) 98.1 F (36.7 C) 97.5 F (36.4 C)   TempSrc:  Oral Oral Oral   SpO2: 94% 94% 95% 95%   Weight:       Height:            No intake or output data in the 24 hours ending 08/26/16  1236    General: Awake, alert, oriented x 3; no acute distress.  HEENT:NC/AT.  MMM  Neck: supple, no lymphadenopathy, no thyromegaly, no JVD, no carotid bruits  Cardiovascular: regular rate and rhythm, no murmurs, rubs or gallops  Lungs: clear to auscultation bilaterally, without wheezing, rhonchi, or rales  Abdomen: soft, non-tender, non-distended; no palpable masses, no hepatosplenomegaly, normoactive bowel sounds, no rebound or guarding  Extremities: no clubbing, cyanosis, or edema  Neuro: cranial nerves grossly intact, strength 5/5 in upper and lower extremities, sensation intact, gross visual field test intact.  Skin: no rashes or lesions noted    Meds:   Medications were reviewed by me:  Current Facility-Administered Medications   Medication Dose Route Frequency   . celecoxib  200 mg Oral Daily   . docusate sodium  100 mg Oral BID   . ezetimibe  10 mg Oral Daily   . FLUoxetine  30 mg Oral Daily   . fluticasone  1 spray Each Nare Daily   . levothyroxine  75 mcg Oral Daily at 0600   . pramipexole  0.25 mg Oral QHS   . senna  17.2 mg Oral BID       Labs:   Labs reviewed personally  include:    Recent Labs  Lab 08/26/16  0402 08/25/16  0328   WBC 7.91 7.81   Hgb 13.8 12.4   Hematocrit 41.2 36.6*   Platelets 272 271      Recent Labs  Lab 08/24/16  2359   PT 12.6   PT INR 0.9   PTT 30         Recent Labs  Lab 08/26/16  0402 08/25/16  0328   Sodium 140 139   Potassium 4.4 4.1   Chloride 107 109   CO2 23 20*   BUN 16.0 21.0*   Creatinine 0.8 0.7   EGFR >60.0 >60.0   Glucose 86 102*   Calcium 8.6 9.1      Recent Labs  Lab 08/24/16  2359   Alkaline Phosphatase 64   Bilirubin, Total 0.3   Protein, Total 6.1   Albumin 3.6   ALT 30   AST (SGOT) 23            Microbiology Results     Procedure Component Value Units Date/Time    MRSA culture [[id number]] Collected:  08/25/16 0328    Specimen:  Body Fluid from Nares and Throat Updated:  08/25/16 0544          Imaging personally reviewed, including:  Radiology Results (24 Hour)     Procedure Component Value Units Date/Time    [facility] Esophagram Complete [[id number]] Collected:  08/26/16 1021    Order Status:  Completed Updated:  08/26/16 1029    Narrative:       HISTORY: Evaluate dysmotility.    COMPARISON: None.    FINDINGS: The scout image of the chest is unremarkable. Patient  swallowed barium without difficulty. Double and single contrast  examination was performed with the patient upright and semirecumbent.    There is esophageal dysmotility with tertiary contractions and delayed  emptying of contrast, with minimal residual in the distal esophagus 10  minutes after the examination. There is mild dilatation of the mid  esophagus as well. No esophageal mucosal lesion, hiatal hernia or  diverticulum is identified. Mild gastroesophageal reflux is noted during  the exam.    Pulse fluoroscopy was utilized.  Fluoroscopy time: Not recorded  29 images were saved.  Impression:          1. 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  2. Mild dilatation of the mid esophagus.  3. Mild gastroesophageal reflux.    Prince Solian, MD    08/26/2016 10:25 AM            Safety Checklist:     DVT prophylaxis:  CHEST guideline (See page [id number]) Mechanical   Foley:  [place] Rn Foley protocol Not present   IVs:  Peripheral IV   PT/OT: Ordered   Daily CBC & or Chem ordered:  SHM/ABIM guidelines (see #5) Yes, due to clinical and lab instability   Reference for approximate charges of common labs: CBC auto diff - $76  BMP - $99  Mg - $79    Lines:     Patient Lines/Drains/Airways Status    Active PICC Line / CVC Line / PIV Line / Drain / Airway / Intraosseous Line / Epidural Line / ART Line / Line / Wound / Pressure Ulcer / NG/OG Tube     Name:   Placement date:   Placement time:   Site:   Days:    Peripheral IV 08/24/16 Left Antecubital  08/24/16        Antecubital    1    Peripheral IV 08/25/16 Left Forearm  08/25/16    0400    Forearm    less than 1                 Disposition:   Today's date: 08/26/2016  Length of Stay: 1  Anticipated medical stability for discharge: July,  8 - Afternoon  Reason for ongoing hospitalization: GI consult; possible EGD  Anticipated discharge needs: to be determined    Signed by: Les Pou, MD  Discussed with Attending MD: Les Pou, MD

## 2016-08-26 NOTE — Plan of Care (Addendum)
Problem: Safety  Goal: Patient will be free from injury during hospitalization  Outcome: Progressing   08/26/16 1700   Goal/Interventions addressed this shift   Patient will be free from injury during hospitalization  Assess patient's risk for falls and implement fall prevention plan of care per policy;Provide and maintain safe environment;Use appropriate transfer methods     Problem: Pain  Goal: Pain at adequate level as identified by patient  Outcome: Progressing   08/26/16 1700   Goal/Interventions addressed this shift   Pain at adequate level as identified by patient Identify patient comfort function goal;Assess for risk of opioid induced respiratory depression, including snoring/sleep apnea. Alert healthcare team of risk factors identified.;Assess pain on admission, during daily assessment and/or before any "as needed" intervention(s)     Comments: Patient is alert and oriented x 4. VSS. Denies  N/V, CP and SOB.  pt is on RA. Up aid lib. Call light within reach, falls precaution maintained, bed in the lowest position. Will continue to monitor patient closely.

## 2016-08-27 ENCOUNTER — Encounter
Admission: EM | Disposition: A | Discharge status: Home / Self Care | Payer: Self-pay | Source: Home / Self Care | Attending: Hospitalist

## 2016-08-27 ENCOUNTER — Encounter: Payer: Self-pay | Admitting: Body Imaging

## 2016-08-27 LAB — CBC
Absolute NRBC: 0 10*3/uL
Hematocrit: 39.4 % (ref 37.0–47.0)
Hgb: 13.6 g/dL (ref 12.0–16.0)
MCH: 29.1 pg (ref 28.0–32.0)
MCHC: 34.5 g/dL (ref 32.0–36.0)
MCV: 84.4 fL (ref 80.0–100.0)
MPV: 9.3 fL — ABNORMAL LOW (ref 9.4–12.3)
Nucleated RBC: 0 /100 WBC (ref 0.0–1.0)
Platelets: 263 10*3/uL (ref 140–400)
RBC: 4.67 10*6/uL (ref 4.20–5.40)
RDW: 13 % (ref 12–15)
WBC: 7.36 10*3/uL (ref 3.50–10.80)

## 2016-08-27 LAB — GFR: EGFR: >60

## 2016-08-27 LAB — BASIC METABOLIC PANEL
BUN: 17 mg/dL (ref 7.0–19.0)
CO2: 22 mEq/L (ref 22–29)
Calcium: 8.5 mg/dL (ref 8.5–10.5)
Chloride: 110 mEq/L (ref 100–111)
Creatinine: 0.7 mg/dL (ref 0.6–1.0)
Glucose: 88 mg/dL (ref 70–100)
Potassium: 4.2 mEq/L (ref 3.5–5.1)
Sodium: 141 mEq/L (ref 136–145)

## 2016-08-27 SURGERY — CEREBRAL ARTERIAL INTERVENTION (GEN)
Anesthesia: Conscious Sedation

## 2016-08-27 MED ORDER — LIDOCAINE HCL (PF) 1 % IJ SOLN
INTRAMUSCULAR | Status: AC
Start: 2016-08-27 — End: ?
  Filled 2016-08-27: qty 30

## 2016-08-27 MED ORDER — MIDAZOLAM HCL 2 MG/2ML IJ SOLN
INTRAMUSCULAR | Status: AC | PRN
Start: 2016-08-27 — End: 2016-08-27
  Administered 2016-08-27: .5 mg via INTRAVENOUS

## 2016-08-27 MED ORDER — FENTANYL CITRATE (PF) 50 MCG/ML IJ SOLN (WRAP)
INTRAMUSCULAR | Status: AC | PRN
Start: 2016-08-27 — End: 2016-08-27
  Administered 2016-08-27: 50 ug via INTRAVENOUS

## 2016-08-27 MED ORDER — FENTANYL CITRATE (PF) 50 MCG/ML IJ SOLN (WRAP)
INTRAMUSCULAR | Status: AC | PRN
Start: 2016-08-27 — End: 2016-08-27
  Administered 2016-08-27: 25 ug via INTRAVENOUS

## 2016-08-27 MED ORDER — MIDAZOLAM HCL 2 MG/2ML IJ SOLN
INTRAMUSCULAR | Status: AC
Start: 2016-08-27 — End: ?
  Filled 2016-08-27: qty 4

## 2016-08-27 MED ORDER — EZETIMIBE 10 MG PO TABS
10.0000 mg | ORAL_TABLET | Freq: Every evening | ORAL | Status: DC
Start: 2016-08-27 — End: 2016-08-28
  Administered 2016-08-27: 10 mg via ORAL
  Filled 2016-08-27: qty 1

## 2016-08-27 MED ORDER — IOHEXOL 240 MG/ML IJ SOLN
107.0000 mL | Freq: Once | INTRAMUSCULAR | Status: AC
Start: 2016-08-27 — End: 2016-08-27
  Administered 2016-08-27: 10:00:00 107 mL

## 2016-08-27 MED ORDER — FENTANYL CITRATE (PF) 50 MCG/ML IJ SOLN (WRAP)
INTRAMUSCULAR | Status: AC
Start: 2016-08-27 — End: ?
  Filled 2016-08-27: qty 4

## 2016-08-27 MED ORDER — LIDOCAINE 1% BUFFERED - CNR/OUTSOURCED
INTRAMUSCULAR | Status: AC | PRN
Start: 2016-08-27 — End: 2016-08-27
  Administered 2016-08-27: 10 mL via INTRADERMAL

## 2016-08-27 MED ORDER — MIDAZOLAM HCL 2 MG/2ML IJ SOLN
INTRAMUSCULAR | Status: AC | PRN
Start: 2016-08-27 — End: 2016-08-27
  Administered 2016-08-27: 1 mg via INTRAVENOUS

## 2016-08-27 MED ORDER — HEPARIN (PORCINE) IN NACL 2-0.9 UNIT/ML-% IJ SOLN
INTRAMUSCULAR | Status: AC
Start: 2016-08-27 — End: ?
  Filled 2016-08-27: qty 3000

## 2016-08-27 MED ORDER — SODIUM CHLORIDE 0.9 % IV SOLN
INTRAVENOUS | Status: DC
Start: 2016-08-27 — End: 2016-08-28

## 2016-08-27 NOTE — Sedation Documentation (Signed)
VASCADE closure device LOT [id number] deployed to right femoral access site. Arterial sheath removed.

## 2016-08-27 NOTE — Sedation Documentation (Signed)
Dr. Aleen Sells completed cerebral arteriogram without complication. Patient tolerated procedure well. VS stable. Pressure held to access site until hemostasis achieved. No bleeding or hematoma noted to access site. Dressing applied by IR tech, Adam. Patient denies pain. Report called to receiving RN. To be transported to room via stretcher.

## 2016-08-27 NOTE — Plan of Care (Signed)
Problem: Safety  Goal: Patient will be free from injury during hospitalization  Outcome: Progressing   08/27/16 0038   Goal/Interventions addressed this shift   Patient will be free from injury during hospitalization  Assess patient's risk for falls and implement fall prevention plan of care per policy;Provide and maintain safe environment;Hourly rounding       Problem: Neurological Deficit  Goal: Neurological status is stable or improving  Outcome: Progressing   08/27/16 0038   Goal/Interventions addressed this shift   Neurological status is stable or improving Monitor/assess/document neurological assessment (Stroke: every 4 hours);Perform CAM Assessment       Comments:   NURSING PROGRESS NOTE        Patient Name: Tracy [name] (66 y.o. female)  Admission Date: 08/25/2016 [facility] Day 2)    Shift Note: alert and oriented x 4 denies pain. Neuro intact. Out of bed with 1 person assist. NPO for Cbr angio in am. Bedside education and discussion completed. Continue to monitor      Recent Labs  Lab 08/26/16  0402   Sodium 140   Potassium 4.4   Chloride 107   CO2 23   BUN 16.0   Creatinine 0.8   EGFR >60.0   Glucose 86   Calcium 8.6         Recent Labs  Lab 08/26/16  0402   WBC 7.91   Hgb 13.8   Hematocrit 41.2   Platelets 272         Patient Lines/Drains/Airways Status    Active Lines, Drains and Airways     Name:   Placement date:   Placement time:   Site:   Days:    Peripheral IV 08/25/16 Left Forearm  08/25/16    0400    Forearm    1                Last BM: 08/26/16  Pending Orders: Cbr Angio, EGD  Discharge Plan: HOME when medically clear  Safety Checklist    Fall Precautions Y    Avasys N    Seizure Precautions N    Aspiration Precautions Y

## 2016-08-27 NOTE — Sedation Documentation (Signed)
Patient arrived to IR procedure room for Cerebral Arteriogram. Patient AAOX4. Informed consent for procedure signed in chart. Patient transferred independently to procedure table to supine position. Monitors attached. Nasal canula applied. Vitals per protocol. Padded arm boards in place. Lawyer in place. Draped and prepped in sterile fashion by IR tech, Adam. Will administer sedation as ordered per MD.

## 2016-08-27 NOTE — Plan of Care (Addendum)
Problem: Safety  Goal: Patient will be free from injury during hospitalization  Outcome: Progressing   08/27/16 1839   Goal/Interventions addressed this shift   Patient will be free from injury during hospitalization  Assess patient's risk for falls and implement fall prevention plan of care per policy;Use appropriate transfer methods;Provide and maintain safe environment     Comments: Patient is alert and oriented x 4. VSS. Denies Pain,  N/V, CP and SOB.  pt is on RA. Right groin dressing intact, no hematoma noted. Up aid lib. Call light within reach, falls precaution maintained, bed in the lowest position. Will continue to monitor patient closely.

## 2016-08-27 NOTE — Progress Notes (Signed)
[facility] HOSPITALIST PROGRESS NOTE    Date Time: 08/27/16 11:53 AM  Patient Name: TracyTracy [name]  Attending Physician: Les Pou, MDMD      Assessment:     Active Hospital Problems    Diagnosis   . SDH (subdural hematoma)   . Subarachnoid hemorrhage   . SAH (subarachnoid hemorrhage)       66 yo F transferred from [name] for mgmt of sah on 08/24/2016. NSGY consulted. MRI brain unchanged and MRV head negative.    Plan:   #Neuro: Left posterior parafalcine convexity SAH. Etiology nontraumatic, likley occurred during vigorous coughing/choking episode.   MRI brain unchanged and MRV head negative. Cerebral Angiogram 7/8 no evidence of aneurysm/avm.  -NSGY following  -Neuro IR for Angiogram today.    - Continue neuro checks  - Continue tylenol for headache  - PT/OT/SLP    #Dysphagia: Hx of dysphagia for several years.  Barium Esophogram shows evidence of dysmotility.  - GI consulted  - EGD on Monday.  NPO tonight at [facility].     - SLP evaluation    Discussed with Neurosurgery, Dr Claudette Laws. Patient clear from Neurosurgery stand point to undergo EGD    #Hypothyroidism  - Continue synthroid    #HTN  - SBP goal 100 -150    #HLD  - Unable to take statins due to hepatic side effects and myalgias  - Continue Zetia    #Depression  - Continue Prozac    #Hx of Restless leg syndrome and OA - Noted    DVT prophylaxis: SCDs. Hold Lovenox for now.      Subjective     CC: Subarachnoid hemorrhage    Interval History/24 hour events: NAE. Patient completed angiogram this AM with Neuro IR.  WIll plan on EGD tomorrow with GI.      HPI: per admitting provider: "Tracy [name] for mgmt of sah on 08/24/2016. The patient reports she was eating dinner this evening and she felt like a piece of food "went down the wrong pipe"/ got stuck and she needed to cough it up. On her second cough/retch she experience a HA that started in the front of the right side of her head and moved down the right towards her neck. She says the HA was the worst HA  of her live and was 10/10.  She took 2 extra strength tylenol. She also had mild blurry vision of her right peripheral vision and mild +photophobia that persists. She also noticed some weakness/tremors in her left arm and leg while being examined at [name]. She denies use of NSAIDs or AC, but does take Celebrex for hip arthritis. She reports minor HA at this time, it has improved with pain medicine, she denies N/V, numbness/tingling, blurry or double vision, CP, or SOB "    Review of Systems:   General - no pain   Resp - no SOB   CVS - no CP, no CAD   GI - no nausea, no diarrhea   CNS - no visual deficit, no weakness    Physical Exam:     Vitals:    08/27/16 1005 08/27/16 1010 08/27/16 1015 08/27/16 1037   BP: 117/59 125/63 142/74 110/63   Pulse: 61 67 76 61   Resp: 16 18 16     Temp:       TempSrc:       SpO2: 93% 96% 97%    Weight:       Height:  Intake/Output Summary (Last 24 hours) at 08/27/16 1153  Last data filed at 08/26/16 1800   Gross per 24 hour   Intake              100 ml   Output              150 ml   Net              -Tracy ml       General: Awake, alert, oriented x 3; no acute distress.  HEENT:NC/AT.  MMM  Neck: supple, no lymphadenopathy, no thyromegaly, no JVD, no carotid bruits  Cardiovascular: regular rate and rhythm, no murmurs, rubs or gallops  Lungs: clear to auscultation bilaterally, without wheezing, rhonchi, or rales  Abdomen: soft, non-tender, non-distended; no palpable masses, no hepatosplenomegaly, normoactive bowel sounds, no rebound or guarding  Extremities: no clubbing, cyanosis, or edema  Neuro: cranial nerves grossly intact, strength 5/5 in upper and lower extremities, sensation intact, gross visual field test intact.  Skin: no rashes or lesions noted    Meds:   Medications were reviewed by me:  Current Facility-Administered Medications   Medication Dose Route Frequency   . celecoxib  200 mg Oral Daily   . docusate sodium  100 mg Oral BID   . ezetimibe  10 mg Oral QHS   .  FLUoxetine  30 mg Oral Daily   . fluticasone  1 spray Each Nare Daily   . levothyroxine  75 mcg Oral Daily at 0600   . pramipexole  0.25 mg Oral QHS   . senna  17.2 mg Oral BID       Labs:   Labs reviewed personally include:    Recent Labs  Lab 08/27/16  0350 08/26/16  0402   WBC 7.36 7.91   Hgb 13.6 13.8   Hematocrit 39.4 41.2   Platelets 263 272      Recent Labs  Lab 08/24/16  2359   PT 12.6   PT INR 0.9   PTT 30         Recent Labs  Lab 08/27/16  0350 08/26/16  0402   Sodium 141 140   Potassium 4.2 4.4   Chloride 110 107   CO2 22 23   BUN 17.0 16.0   Creatinine 0.7 0.8   EGFR >60.0 >60.0   Glucose 88 86   Calcium 8.5 8.6      Recent Labs  Lab 08/24/16  2359   Alkaline Phosphatase 64   Bilirubin, Total 0.3   Protein, Total 6.1   Albumin 3.6   ALT 30   AST (SGOT) 23            Microbiology Results     Procedure Component Value Units Date/Time    MRSA culture [[id number]] Collected:  08/25/16 0328    Specimen:  Body Fluid from Nares and Throat Updated:  08/25/16 0544          Imaging personally reviewed, including:  Radiology Results (24 Hour)     Procedure Component Value Units Date/Time    Cerebral Arterial Intervention (Gen) [[id number]] Resulted:  08/27/16 0844    Order Status:  Sent Updated:  08/27/16 1031            Safety Checklist:     DVT prophylaxis:  CHEST guideline (See page e199S) Mechanical   Foley:  Franklin Rn Foley protocol Not present   IVs:  Peripheral IV   PT/OT: Ordered   Daily CBC & or  Chem ordered:  SHM/ABIM guidelines (see #5) Yes, due to clinical and lab instability   Reference for approximate charges of common labs: CBC auto diff - $76  BMP - $99  Mg - $79    Lines:     Patient Lines/Drains/Airways Status    Active PICC Line / CVC Line / PIV Line / Drain / Airway / Intraosseous Line / Epidural Line / ART Line / Line / Wound / Pressure Ulcer / NG/OG Tube     Name:   Placement date:   Placement time:   Site:   Days:    Peripheral IV 08/24/16 Left Antecubital  08/24/16        Antecubital    1     Peripheral IV 08/25/16 Left Forearm  08/25/16    0400    Forearm    less than 1                 Disposition:   Today's date: 08/27/2016  Length of Stay: 2  Anticipated medical stability for discharge: July,  8 - Afternoon  Reason for ongoing hospitalization: GI consult; possible EGD  Anticipated discharge needs: to be determined    Signed by: Les Pou, MD  Discussed with Attending MD: Les Pou, MD

## 2016-08-27 NOTE — Consults (Signed)
INR    66 y/o woman with SAH for angio. Developed severe headache with episode of gaging. Now being worked up for swallowing issues.    PMH:    Restless leg syndrome  Osteoporosis  htn  Depression    All: Erythro, neosporin, sulfa    PE:    Vss  Alert and oriented  Normal cn  Strength full  Chest clear  Abdomen normal    ASA 2  Ms 2    Impression:   Small cortical SAH for cerebral angio.  Reviewed a description of the procedure, risks and benefits with the patient .  She singed an informed consent form.    Amedeo Kinsman MD

## 2016-08-27 NOTE — Procedures (Signed)
No angio evidence of aneurysm, avm or davm

## 2016-08-28 ENCOUNTER — Ambulatory Visit: Payer: Self-pay

## 2016-08-28 ENCOUNTER — Inpatient Hospital Stay: Payer: BLUE CROSS/BLUE SHIELD | Admitting: Anesthesiology

## 2016-08-28 ENCOUNTER — Encounter
Admission: EM | Disposition: A | Discharge status: Home / Self Care | Payer: Self-pay | Source: Home / Self Care | Attending: Hospitalist

## 2016-08-28 DIAGNOSIS — K2950 Unspecified chronic gastritis without bleeding: Secondary | ICD-10-CM

## 2016-08-28 DIAGNOSIS — K2980 Duodenitis without bleeding: Secondary | ICD-10-CM

## 2016-08-28 LAB — CBC
Absolute NRBC: 0 10*3/uL
Hematocrit: 39.4 % (ref 37.0–47.0)
Hgb: 13.1 g/dL (ref 12.0–16.0)
MCH: 28.5 pg (ref 28.0–32.0)
MCHC: 33.2 g/dL (ref 32.0–36.0)
MCV: 85.7 fL (ref 80.0–100.0)
MPV: 9 fL — ABNORMAL LOW (ref 9.4–12.3)
Nucleated RBC: 0 /100 WBC (ref 0.0–1.0)
Platelets: 267 10*3/uL (ref 140–400)
RBC: 4.6 10*6/uL (ref 4.20–5.40)
RDW: 13 % (ref 12–15)
WBC: 7.29 10*3/uL (ref 3.50–10.80)

## 2016-08-28 LAB — BASIC METABOLIC PANEL
BUN: 16 mg/dL (ref 7.0–19.0)
CO2: 19 mEq/L — ABNORMAL LOW (ref 22–29)
Calcium: 8.4 mg/dL — ABNORMAL LOW (ref 8.5–10.5)
Chloride: 112 mEq/L — ABNORMAL HIGH (ref 100–111)
Creatinine: 0.7 mg/dL (ref 0.6–1.0)
Glucose: 90 mg/dL (ref 70–100)
Potassium: 4.2 mEq/L (ref 3.5–5.1)
Sodium: 138 mEq/L (ref 136–145)

## 2016-08-28 LAB — GFR: EGFR: >60

## 2016-08-28 SURGERY — DONT USE, USE 1095-ESOPHAGOGASTRODUODENOSCOPY (EGD), DIAGNOSTIC
Anesthesia: Anesthesia General | Site: Abdomen

## 2016-08-28 MED ORDER — LACTATED RINGERS IV SOLN
INTRAVENOUS | Status: DC | PRN
Start: 2016-08-28 — End: 2016-08-28

## 2016-08-28 MED ORDER — PANTOPRAZOLE SODIUM 40 MG PO TBEC
40.0000 mg | DELAYED_RELEASE_TABLET | Freq: Every morning | ORAL | 0 refills | Status: AC
Start: 2016-08-28 — End: ?

## 2016-08-28 MED ORDER — PROPOFOL INFUSION 10 MG/ML
INTRAVENOUS | Status: DC | PRN
Start: 2016-08-28 — End: 2016-08-28
  Administered 2016-08-28: 200 ug/kg/min via INTRAVENOUS

## 2016-08-28 MED ORDER — PROPOFOL INFUSION 10 MG/ML
INTRAVENOUS | Status: DC | PRN
Start: 2016-08-28 — End: 2016-08-28
  Administered 2016-08-28: 80 mg via INTRAVENOUS
  Administered 2016-08-28: 20 mg via INTRAVENOUS

## 2016-08-28 MED ORDER — PANTOPRAZOLE SODIUM 40 MG PO TBEC
40.0000 mg | DELAYED_RELEASE_TABLET | Freq: Every morning | ORAL | Status: DC
Start: 2016-08-28 — End: 2016-08-28
  Administered 2016-08-28: 18:00:00 40 mg via ORAL
  Filled 2016-08-28: qty 1

## 2016-08-28 SURGICAL SUPPLY — 28 items
BASIN EMESIS ROSE (Patient Supply) ×2 IMPLANT
BLOCK BITE MAXI 60FR LF STRD STRAP SDPRT (Procedure Accessories) ×1
BLOCK BITE OD60 FR STURDY STRAP SIDEPORT (Procedure Accessories) ×1
BLOCK BITE OD60 FR STURDY STRAP SIDEPORT DENTAL RETENTION RIM MAXI (Procedure Accessories) ×1 IMPLANT
CANISTER SCT 1500CC SAFELINER LF NS SMRG (Suction) ×1
CANISTER SUCTION 1500 CC SEMIRIGID 1 (Suction) ×1
CANISTER SUCTION 1500 CC SEMIRIGID 1 ELBOW FILTER SELF ALIGN LID (Suction) ×1 IMPLANT
CONTAINER HISTOLOGY 60 ML 30 ML GRADUATE LEAK RESISTANT O RING PREFILL (Procedure Accessories) IMPLANT
DEVICE ENDOSCOPIC RAPID EXCHANGE BIOPSY (Procedure Accessories) ×1
DEVICE ENDOSCOPIC RAPID EXCHANGE BIOPSY CAP OLYMPUS (Procedure Accessories) ×1 IMPLANT
DEVICE ESCP STRL RX BX CAP DISP OLMPS (Procedure Accessories) ×1
FORCEPS BIOPSY L240 CM LARGE CAPACITY (Procedure Accessories) ×1
FORCEPS BIOPSY L240 CM LARGE CAPACITY MICROMESH TEETH STREAMLINE (Procedure Accessories) IMPLANT
FORCEPS BX SS LG CPC RJ 4 2.4MM 240CM (Procedure Accessories) ×1
GLOVE EXAM NITRILE L (Glove) ×2 IMPLANT
GOWN ISL PP PE REG LG LF FULL BCK NK TIE (Gown) ×2
GOWN ISOLATION REGULAR LARGE FULL BACK NECK TIE ELASTIC CUFF (Gown) ×1 IMPLANT
JELLY LUB EZ LF STRL H2O SOL NGRS TRNLU (Irrigation Solutions) ×2 IMPLANT
SOL FORMALIN 10% PREFILL 30ML (Procedure Accessories) ×6
SPONGE GAUZE L4 IN X W4 IN 4 PLY HIGH (Sponge) ×1
SPONGE GAUZE L4 IN X W4 IN 4 PLY NONWOVEN LINT FREE CURITY RAYON (Sponge) ×1 IMPLANT
SPONGE GZE RYN PLSTR CRTY 4X4IN LF NS 4 (Sponge) ×1
SYRINGE 50 ML GRADUATE NONPYROGENIC DEHP (Syringes, Needles) ×1
SYRINGE 50 ML GRADUATE NONPYROGENIC DEHP FREE PVC FREE BD MEDICAL (Syringes, Needles) ×1 IMPLANT
SYRINGE MED 50ML LF STRL GRAD N-PYRG (Syringes, Needles) ×1
TUBING ENDOSCOPY EXTENSION (Endoscopic Supplies) ×2 IMPLANT
WATER STERILE PVC FREE DEHP FREE 1000 ML (Solution) ×1 IMPLANT
WATER STRL 1000ML PIC LF PVC FR DEHP-FR (Solution) ×1

## 2016-08-28 NOTE — Plan of Care (Signed)
Problem: Safety  Goal: Patient will be free from injury during hospitalization  Outcome: Progressing   08/28/16 0127   Goal/Interventions addressed this shift   Patient will be free from injury during hospitalization  Assess patient's risk for falls and implement fall prevention plan of care per policy;Provide and maintain safe environment;Use appropriate transfer methods;Ensure appropriate safety devices are available at the bedside;Include patient/ family/ care giver in decisions related to safety;Hourly rounding;Assess for patients risk for elopement and implement Elopement Risk Plan per policy;Provide alternative method of communication if needed (communication boards, writing)       Problem: Pain  Goal: Pain at adequate level as identified by patient  Outcome: Progressing   08/28/16 0127   Goal/Interventions addressed this shift   Pain at adequate level as identified by patient Identify patient comfort function goal;Assess for risk of opioid induced respiratory depression, including snoring/sleep apnea. Alert healthcare team of risk factors identified.;Assess pain on admission, during daily assessment and/or before any "as needed" intervention(s);Reassess pain within 30-60 minutes of any procedure/intervention, per Pain Assessment, Intervention, Reassessment (AIR) Cycle;Evaluate if patient comfort function goal is met;Evaluate patient's satisfaction with pain management progress;Offer non-pharmacological pain management interventions;Consult/collaborate with Pain Service;Consult/collaborate with Physical Therapy, Occupational Therapy, and/or Speech Therapy;Include patient/patient care companion in decisions related to pain management as needed       Problem: Every Day - Stroke  Goal: Core/Quality measure requirements - Daily  Outcome: Progressing    Goal: Elimination patterns are normal or improving  Outcome: Progressing   08/28/16 0127   Goal/Interventions addressed this shift   Elimination patterns are normal  or improving Report abnormal assessment to physician;Anticipate/assist with toileting needs;Assess for normal bowel sounds;Monitor for abdominal discomfort;Assess for signs and symptoms of bleeding. Report signs of bleeding to physician;Monitor for abdominal distension;Administer treatments as ordered;Consult/collaborate with Clinical Nutritionist;Assess for flatus;Assess for and discuss C. diff screening with LIP;Collaborate with LIP for containment device;Reinforce education on foods that improve and complicate bowel movements and how activity and medications can affect bowel movements;Administer medications to improve bowel evacuation as prescribed;Encourage /perform oral hygiene as appropriate

## 2016-08-28 NOTE — H&P (Signed)
GI PRE PROCEDURE NOTE    Proceduralist Comments:   Review of Systems and Past Medical / Surgical History performed: Yes     Indications:Dysphagia    Previous Adverse Reaction to Anesthesia or Sedation (if yes, describe): No    Physical Exam / Laboratory Data (If applicable)   General: Alert and cooperative  Lungs: Lungs clear to auscultation  Cardiac: RRR, normal S1S2.    Abdomen: Soft, non tender. Normal active bowel sounds  Other:       Recent BMP   Recent Labs      08/28/16   1141   Glucose  90   BUN  16.0   Creatinine  0.7   Calcium  8.4*   Sodium  138   Potassium  4.2   Chloride  112*   CO2  19*       American Society of Anesthesiologists (ASA) Physical Status Classification:   Anesthesia ASA Score:           Planned Sedation:   Deep sedation with anesthesia    Attestation:   Tracy [name] has been reassessed immediately prior to the procedure and is an appropriate candidate for the planned sedation and procedure. Risks, benefits and alternatives to the planned procedure and sedation have been explained to the patient or guardian:  yes        Signed by: Lestine Mount

## 2016-08-28 NOTE — Transfer of Care (Signed)
Anesthesia Transfer of Care Note    Patient: Tracy [name]    Procedures performed: Procedure(s):  EGD    Anesthesia type: General TIVA    Patient location:Phase II PACU    Last vitals:   Vitals:    08/28/16 1412   BP: 127/66   Pulse: 76   Resp: 18   Temp: 36.6 C (97.8 F)   SpO2: 97%       Post pain: Patient not complaining of pain, continue current therapy      Mental Status:sedated    Respiratory Function: tolerating nasal cannula    Cardiovascular: stable    Nausea/Vomiting: patient not complaining of nausea or vomiting    Hydration Status: adequate    Post assessment: no apparent anesthetic complications    Signed by: Garfield Cornea  08/28/16 3:16 PM

## 2016-08-28 NOTE — Discharge Instr - AVS First Page (Signed)
Reason for your Hospital Admission:  Dysphagia; Subarachnoid Hemorrhage      Instructions for after your discharge:  Please follow up with Neurosurgery after discharge    Please follow up with your Primary Care Provider after discharge      Please avoid taking any medications that thin your blood including but not limited to antiplatelets (aspirin products), NSAIDs (ibuprofen, advil, naproxyn, etc.), or anticoagulation.

## 2016-08-28 NOTE — Plan of Care (Addendum)
Problem: Safety  Goal: Patient will be free from injury during hospitalization  Outcome: Progressing   08/28/16 0141   Goal/Interventions addressed this shift   Patient will be free from injury during hospitalization  Assess patient's risk for falls and implement fall prevention plan of care per policy;Provide and maintain safe environment;Use appropriate transfer methods;Ensure appropriate safety devices are available at the bedside;Include patient/ family/ care giver in decisions related to safety;Hourly rounding;Assess for patients risk for elopement and implement Elopement Risk Plan per policy;Provide alternative method of communication if needed (communication boards, writing)       Problem: Every Day - Stroke  Goal: Core/Quality measure requirements - Daily  Outcome: Progressing   08/28/16 0141   Goal/Interventions addressed this shift   Core/Quality measure requirements - Daily  VTE Prevention: Ensure anticoagulant(s) administered and/or anti-embolism stockings/devices documented by end of day 2;Ensure antithrombotic administered or contraindication documented by LIP by end of day 2;Once lipid panel has resulted, check LDL. Contact provider for statin order if LDL > 70 (or ensure contraindication documented by LIP).;Continue stroke education (must include Modifiable Risk Factors, Warning Signs and Symptoms of Stroke, Activation of Emergency Medical System and Follow-up Appointments). Ensure handout has been given and documented.     Goal: Nutritional intake is adequate  Outcome: Progressing   08/28/16 0141   Goal/Interventions addressed this shift   Nutritional intake is adequate Monitor daily weights;Assist patient with meals/food selection;Encourage/perform oral hygiene as appropriate;Allow adequate time for meals;Include patient/patient care companion in decisions related to nutrition;Assess anorexia, appetite, and amount of meal/food tolerated     NURSING PROGRESS NOTE    Patient Name: Tracy Demedeiros  [name] (66 y.o. female)  Admission Date: 08/25/2016 [facility] Day 3)    Shift Note: Pt A&Ox4. MAE. Follows commands. VSS. Denied any pain. OOB to BR w/SB. NPO status since midnight. Fall and safety precaution in place.       Recent Labs  Lab 08/27/16  0350   Sodium 141   Potassium 4.2   Chloride 110   CO2 22   BUN 17.0   Creatinine 0.7   EGFR >60.0   Glucose 88   Calcium 8.5         Recent Labs  Lab 08/27/16  0350   WBC 7.36   Hgb 13.6   Hematocrit 39.4   Platelets 263         Patient Lines/Drains/Airways Status    Active Lines, Drains and Airways     Name:   Placement date:   Placement time:   Site:   Days:    Peripheral IV 08/25/16 Left Forearm  08/25/16    0400    Forearm    2                Last BM: 08/27/16  Pending Orders: EGD  Discharge Plan: TBD    Safety Checklist    Fall Precautions N    Avasys N    Seizure Precautions N    Aspiration Precautions N

## 2016-08-28 NOTE — Anesthesia Preprocedure Evaluation (Signed)
Anesthesia Evaluation    AIRWAY    Mallampati: II    TM distance: >3 FB  Neck ROM: full  Mouth Opening:full   CARDIOVASCULAR           DENTAL    no notable dental hx     PULMONARY    pulmonary exam normal     OTHER FINDINGS    Hx of Difficult Intubation    1. Dysphagia: This has been present for the last ~30-40 years with prior unclear GI evaluation when she was in her 19's. Episode of dysphagia yesterday resulted in cough followed by headaches found to be 2/2 subarachnoid hemorrhage. DDx includes esophagitis, stricture, web, ring, motility disorder.  2. Subarachnoid hemorrhage: plan as per Neurosurgery .          Relevant Problems   No relevant active problems               Anesthesia Plan    ASA 3     general                                 informed consent obtained      pertinent labs reviewed             Signed by: Garfield Cornea 08/28/16 2:50 PM

## 2016-08-28 NOTE — Plan of Care (Signed)
Problem: Safety  Goal: Patient will be free from injury during hospitalization  Outcome: Adequate for Discharge   08/28/16 0141   Goal/Interventions addressed this shift   Patient will be free from injury during hospitalization  Assess patient's risk for falls and implement fall prevention plan of care per policy;Provide and maintain safe environment;Use appropriate transfer methods;Ensure appropriate safety devices are available at the bedside;Include patient/ family/ care giver in decisions related to safety;Hourly rounding;Assess for patients risk for elopement and implement Elopement Risk Plan per policy;Provide alternative method of communication if needed (communication boards, writing)     Goal: Patient will be free from infection during hospitalization  Outcome: Adequate for Discharge   08/28/16 1622   Goal/Interventions addressed this shift   Free from Infection during hospitalization Assess and monitor for signs and symptoms of infection;Encourage patient and family to use good hand hygiene technique       Problem: Pain  Goal: Pain at adequate level as identified by patient  Outcome: Adequate for Discharge   08/28/16 0127   Goal/Interventions addressed this shift   Pain at adequate level as identified by patient Identify patient comfort function goal;Assess for risk of opioid induced respiratory depression, including snoring/sleep apnea. Alert healthcare team of risk factors identified.;Assess pain on admission, during daily assessment and/or before any "as needed" intervention(s);Reassess pain within 30-60 minutes of any procedure/intervention, per Pain Assessment, Intervention, Reassessment (AIR) Cycle;Evaluate if patient comfort function goal is met;Evaluate patient's satisfaction with pain management progress;Offer non-pharmacological pain management interventions;Consult/collaborate with Pain Service;Consult/collaborate with Physical Therapy, Occupational Therapy, and/or Speech Therapy;Include  patient/patient care companion in decisions related to pain management as needed

## 2016-08-28 NOTE — Progress Notes (Signed)
DISCHARGE NOTE      Patient: [name],Tracy [name] (66 y.o., 15-Apr-1950)  Admission Date: 08/25/2016 (LOS: 3)    Discharge Date: 08/28/16  Discharge Disposition: Home  Report Given: N/A  Transportation Method: Home with friends  Additional Discharge Info: Pt is A&Ox4, MAE, F/C, verbalizes needs well. C?O mild HA with Tylenol provided x 1. Ambulating with steady gait. Off unit for EGD with finding of esophagitis. Pt started on Protonix with first dose given. Discharge instructions provided to pt with verbalized understanding. Pt discharged home with friends with all belongings at 34.    Discharge Medication List       Medication List      START taking these medications    pantoprazole 40 MG tablet  Commonly known as:  PROTONIX  Take 1 tablet (40 mg total) by mouth every morning before breakfast.        CONTINUE taking these medications    co-enzyme Q-10 30 MG capsule     desoximetasone 0.05 % cream  Commonly known as:  TOPICORT     ezetimibe 10 MG tablet  Commonly known as:  ZETIA     fenofibrate 145 MG tablet  Commonly known as:  TRICOR     FLUoxetine 10 MG tablet  Commonly known as:  PROzac     fluticasone 50 MCG/ACT nasal spray  Commonly known as:  FLONASE     gabapentin 300 MG capsule  Commonly known as:  NEURONTIN     glucosamine-chondroitin 500-400 MG tablet     levothyroxine 75 MCG tablet  Commonly known as:  SYNTHROID, LEVOTHROID     multivitamin with minerals tablet     Omega-3 Fish Oil 500 MG Caps     PRALUENT 150 MG/ML Sopn  Generic drug:  Alirocumab     pramipexole 0.25 MG tablet  Commonly known as:  MIRAPEX     SYSTANE NIGHTTIME Oint     Vitamin D 1000 units capsule        STOP taking these medications    celecoxib 200 MG capsule  Commonly known as:  CeleBREX           Where to Get Your Medications      You can get these medications from any pharmacy    Bring a paper prescription for each of these medications   pantoprazole 40 MG tablet

## 2016-08-28 NOTE — Anesthesia Postprocedure Evaluation (Signed)
Anesthesia Post Evaluation    Patient: Tracy [name]    Procedure(s):  EGD    Anesthesia type: General TIVA    Last Vitals:   Vitals:    08/28/16 1412   BP: 127/66   Pulse: 76   Resp: 18   Temp: 36.6 C (97.8 F)   SpO2: 97%         Patient Location: [facility]    Post Pain: Patient not complaining of pain, continue current therapy    Mental Status: awake and alert    Respiratory Function: tolerating nasal cannula    Cardiovascular: stable    Nausea/Vomiting: patient not complaining of nausea or vomiting    Hydration Status: adequate    Post Assessment: no apparent anesthetic complications, no reportable events and no evidence of recall          Anesthesia Qualified Clinical Data Registry 2018    PACU Reintubation  Did the Patient have general anesthesia with intubation: No        PONV Adult  Is the patient aged 66 or older: Yes  Did the patient receive recieve a general anesthestic: No          PONV Pediatric  Is the patient aged 30-17? No            PACU Transfer Checklist Protocol  Was the patient transferred to the PACU at the conclusion of surgery? Yes  Was a checklist or transfer protocol used? Yes    ICU Transfer Checklist Protocol  Was the patient transferred to the ICU at the conclusion of surgery? No      Post-op Pain Assessment Prior to Anesthesia Care End  Age >=18 and assessed for pain in PACU: Yes  Pacu pain score <7/10: Yes      Perioperative Mortality  Perioperative mortality prior to Anesthesia end time: No    Perioperative Cardiac Arrest  Did the patient have an unanticipated intraoperative cardiac arrest between anesthesia start time and anesthesia end time? No    Unplanned Admission to ICU  Did the patient have an unplanned admission to the ICU (not initially anticipated at anesthesia start time)? No      Signed by: Garfield Cornea, 08/28/2016 3:19 PM

## 2016-08-28 NOTE — Discharge Summary (Signed)
[facility] HOSPITALIST DISCHARGE SUMMARY    Date Time: 08/28/16 5:24 PM  Patient Name: Tracy [name],Tracy [name] [facility]  Attending Physician: Les Pou, MDMD    Date of Admission:   08/25/2016    Date of Discharge:   08/28/2016    Reason for Admission:   SAH (subarachnoid hemorrhage) [I60.9]  SDH (subdural hematoma) [I62.00]  Subarachnoid hemorrhage [I60.9]  SDH (subdural hematoma) [I62.00]    Problems:   Lists the present on admission hospital problems  Present on Admission:  . SDH (subdural hematoma)  . Subarachnoid hemorrhage  . SAH (subarachnoid hemorrhage)  . Dysphagia      Problem Lists:  Patient Active Problem List   Diagnosis   . Thyroid disease   . Depression   . Hyperlipidemia   . Health care maintenance   . Osteoporosis   . History of colonoscopy   . SDH (subdural hematoma)   . Subarachnoid hemorrhage   . SAH (subarachnoid hemorrhage)   . Dysphagia       Discharge Dx:   SAH (subarachnoid hemorrhage) [I60.9]  SDH (subdural hematoma) [I62.00]  Subarachnoid hemorrhage [I60.9]  SDH (subdural hematoma) [I62.00]    Consultations:   Treatment Team: Attending Provider: Les Pou, MD; Consulting Physician: Brunetta Genera, MD; Consulting Physician: Kenney Houseman, MD; Case Manager: Shinrock, Jane Canary Consulting Physician: Les Pou, MD; Nurse Practitioner: Leanor Rubenstein, FNP; Technician: Patrica Duel; Registered Nurse: Gustavus Bryant, RN; Surgeon: Lestine Mount, MD    Procedures performed:     Cerebral Arterial Intervention (Gen)   Final Result         No angiographic evidence of intracranial aneurysm, arteriovenous   malformation or dural fistula.      Darleen Crocker, MD    08/28/2016 10:21 AM      FL Esophagram Complete   Final Result       1. Esophageal dysmotility with tertiary contractions and delayed   emptying.   2. Mild dilatation of the mid esophagus.   3. Mild gastroesophageal reflux.      Prince Solian, MD    08/26/2016 10:25 AM      MRI Brain W WO Contrast   Final Result       1.  Small focal area  of subarachnoid hemorrhage about the left   paramedian parietal  lobe, unchanged.            Einar Pheasant, MD    08/25/2016 8:17 AM      MRV Head W WO Contrast   Final Result       1.  Negative intracranial MR venogram.   2.  Negative intracranial MR arteriogram.        Einar Pheasant, MD    08/25/2016 8:16 AM          Presenting history and hospital Course:   HPI per admitting provider  ""66 yo F transferred from OSH for mgmt of sah on 08/24/2016. The patient reports she was eating dinner this evening and she felt like a piece of food "went down the wrong pipe"/ got stuck and she needed to cough it up. On her second cough/retch she experience a HA that started in the front of the right side of her head and moved down the right towards her neck. She says the HA was the worst HA of her live and was 10/10. She took 2 extra strength tylenol. She also had mild blurry vision of her right peripheral vision and mild +photophobia  that persists. She also noticed some weakness/tremors in her left arm and leg while being examined at OSH. She denies use of NSAIDs or AC, but does take Celebrex for hip arthritis. She reports minor HA at this time, it has improved with pain medicine, she denies N/V, numbness/tingling, blurry or double vision, CP, or SOB "  "      HOSPITAL COURSE    66 yo F transferred from OSH for mgmt of sah on 08/24/2016. NSGY consulted. MRI brain unchanged and MRV head negative.    #Neuro: Left posterior parafalcine convexity SAH. Etiology nontraumatic, likley occurred during vigorous coughing/choking episode.   MRI brain unchanged and MRV head negative. Cerebral Angiogram 7/8 no evidence of aneurysm/avm. Patient to f/u with Neurosurgery, Dr Claudette Laws.      #Dysphagia: Hx of dysphagia for several years.  Barium Esophogram shows evidence of dysmotility. EGD on 7/9 with distal ring noted, sp dilation. Will start protonix and f/u with GI as outpatient regarding biopsies from EGD.    Advised to avoid taking any medications  that thin your blood including but not limited to antiplatelets (aspirin products), NSAIDs (ibuprofen, advil, naproxyn, etc.), or anticoagulation.      #Hypothyroidism  - Continue synthroid    #HTN  - SBP goal 100 -150    #HLD  - Unable to take statins due to hepatic side effects and myalgias  - Continue Zetia    #Depression  - Continue Prozac    #Hx of Restless leg syndrome and OA - Noted      Disposition:  HOME    Physical exam at discharge:  Vitals:    08/28/16 1603   BP: 121/65   Pulse: 68   Resp: 18   Temp: (!) 96.5 F (35.8 C)   SpO2: 98%     General: awake, alert, oriented x 3; no acute distress.  HEENT: NC/AT  Neck: supple,  Cardiovascular: regular rate and rhythm, no murmurs, rubs or gallops  Lungs: clear to auscultation bilaterally, without wheezing, rhonchi, or rales  Abdomen: soft, non-tender, non-distended;  normoactive bowel sounds, no rebound or guarding  Extremities: no clubbing, cyanosis, or edema  Neuro: cranial nerves grossly intact, strength 5/5 in upper and lower extremities, sensation intact  Skin: no rashes or lesions noted    Discharge Medications:        Medication List      START taking these medications    pantoprazole 40 MG tablet  Commonly known as:  PROTONIX  Take 1 tablet (40 mg total) by mouth every morning before breakfast.        CONTINUE taking these medications    co-enzyme Q-10 30 MG capsule     desoximetasone 0.05 % cream  Commonly known as:  TOPICORT     ezetimibe 10 MG tablet  Commonly known as:  ZETIA     fenofibrate 145 MG tablet  Commonly known as:  TRICOR     FLUoxetine 10 MG tablet  Commonly known as:  PROzac     fluticasone 50 MCG/ACT nasal spray  Commonly known as:  FLONASE     gabapentin 300 MG capsule  Commonly known as:  NEURONTIN     glucosamine-chondroitin 500-400 MG tablet     levothyroxine 75 MCG tablet  Commonly known as:  SYNTHROID, LEVOTHROID     multivitamin with minerals tablet     Omega-3 Fish Oil 500 MG Caps     PRALUENT 150 MG/ML Sopn  Generic drug:   Alirocumab  pramipexole 0.25 MG tablet  Commonly known as:  MIRAPEX     SYSTANE NIGHTTIME Oint     Vitamin D 1000 units capsule        STOP taking these medications    celecoxib 200 MG capsule  Commonly known as:  CeleBREX           Where to Get Your Medications      You can get these medications from any pharmacy    Bring a paper prescription for each of these medications   pantoprazole 40 MG tablet          Discharge Instructions:   Roni Bread, MD  [address]  [place] [address]  [phone]    Follow up      Brunetta Genera, MD  [address]  [place] [address]  [phone]    Follow up      Annalee Genta, MD  [address]  [place] [address]  [phone]    Follow up          TIME SPENT:   On discharge and care coordination is 45 minutes.    Signed by: Les Pou, MD    CC to: Roni Bread, MD

## 2016-08-29 ENCOUNTER — Encounter: Payer: Self-pay | Admitting: Gastroenterology

## 2016-08-30 LAB — LAB USE ONLY - HISTORICAL SURGICAL PATHOLOGY

## 2016-09-22 ENCOUNTER — Emergency Department
Admission: EM | Admit: 2016-09-22 | Discharge: 2016-09-22 | Disposition: A | Discharge status: Home / Self Care | Payer: BLUE CROSS/BLUE SHIELD | Attending: Emergency Medicine | Admitting: Emergency Medicine

## 2016-09-22 ENCOUNTER — Emergency Department: Payer: BLUE CROSS/BLUE SHIELD

## 2016-09-22 DIAGNOSIS — Z90710 Acquired absence of both cervix and uterus: Secondary | ICD-10-CM | POA: Insufficient documentation

## 2016-09-22 DIAGNOSIS — R519 Headache, unspecified: Secondary | ICD-10-CM

## 2016-09-22 DIAGNOSIS — G2581 Restless legs syndrome: Secondary | ICD-10-CM | POA: Insufficient documentation

## 2016-09-22 DIAGNOSIS — E785 Hyperlipidemia, unspecified: Secondary | ICD-10-CM | POA: Insufficient documentation

## 2016-09-22 DIAGNOSIS — Z79899 Other long term (current) drug therapy: Secondary | ICD-10-CM | POA: Insufficient documentation

## 2016-09-22 DIAGNOSIS — I10 Essential (primary) hypertension: Secondary | ICD-10-CM | POA: Insufficient documentation

## 2016-09-22 DIAGNOSIS — E039 Hypothyroidism, unspecified: Secondary | ICD-10-CM | POA: Insufficient documentation

## 2016-09-22 DIAGNOSIS — R51 Headache: Secondary | ICD-10-CM | POA: Insufficient documentation

## 2016-09-22 DIAGNOSIS — Z85038 Personal history of other malignant neoplasm of large intestine: Secondary | ICD-10-CM | POA: Insufficient documentation

## 2016-09-22 LAB — SEDIMENTATION RATE: Sed Rate: 11 mm/Hr (ref 0–20)

## 2016-09-22 NOTE — ED Notes (Signed)
This patient was seen by me in triage and initial testing was ordered based on presenting complaint. Care was expedited. I am not the primary provider for this patient.   66 y.o. recent SAH here with HA     Joice Lofts, MD  09/22/16 1615

## 2016-09-22 NOTE — ED Provider Notes (Signed)
[facility] EMERGENCY DEPARTMENT H&P      Visit date: 09/22/2016      CLINICAL SUMMARY           Diagnosis:    .     Final diagnoses:   Nonintractable headache, unspecified chronicity pattern, unspecified headache type         MDM Notes:    66 year old female with history of subarachnoid hemorrhage related to vigorous coughing.  No aneurysm, no amyloid.  No AVM presents with persistent intermittent headache.  That is migratory nonlocalizing.  No fever, no chills, no neck stiffness.  CT scan at in the emerge department was unrevealing for acute abnormality.  Sedimentation rate was obtained given tenderness in the temporal region bilaterally and it was normal.  Discussed with neuro interventional list, who directed her to the emergency department.  Discharge with follow-up           Disposition:         Discharge         Discharge Prescriptions     None                      CLINICAL INFORMATION        HPI:      Chief Complaint: Headache  .    Tracy [name] is a 66 y.o. female who is approximately 1 month s/p admission for [facility] (states occurred after choking on food, no infiltrative disease process, medically managed without surgical intervention, discharged 08/28/16) who presents with several episodes of what she describes as residual head discomfort, becoming increasingly worse today. States today her HA began in the BL temples and has since been migratory across her head. She took 2 extra strength Tylenol before work with minimal relief, was seen by her PCP Dr. Carolynn Serve this afternoon and he was unable to contact either her regular neurologist Dr. Patricia Nettle or her interventional neurologist Dr. Earvin Hansen, prompting him to refer her to the ED for further evaluation. She states she did follow up with both Dr. Patricia Nettle and Dr. Earvin Hansen after she was discharged and was released to work on light duty with no heavy lifting. States she has also been evaluated by her cardiologist since  discharge and is supposed to have a stress test in the near future due to her h/o HLD.     She states her HA in ED is currently a 4/10 in severity, denies any visual sxs.    History obtained from: Patient          ROS:      Positive and negative ROS elements as per HPI.  All other systems reviewed and negative.      Physical Exam:      Pulse 88  BP 131/63  Resp 18  SpO2 97 %  Temp 98.1 F (36.7 C)    Physical Exam   Constitutional: She is oriented to person, place, and time. She appears well-developed and well-nourished. No distress.   HENT:   Head: Normocephalic and atraumatic.   Right Ear: External ear normal.   Left Ear: External ear normal.   Mild BL tenderness to the temporal region.   Eyes: Pupils are equal, round, and reactive to light. Conjunctivae and EOM are normal.   Neck: Normal range of motion. Neck supple. No tracheal deviation present. No thyromegaly present.   Cardiovascular: Normal rate, regular rhythm and normal heart sounds.  Exam reveals no gallop and no friction rub.    No murmur  heard.  Pulmonary/Chest: Effort normal and breath sounds normal. No stridor. No respiratory distress. She has no wheezes. She has no rales.   Abdominal: Soft. She exhibits no distension. There is no tenderness. There is no rebound and no guarding.   Musculoskeletal: Normal range of motion. She exhibits no edema or tenderness.   Neurological: She is alert and oriented to person, place, and time. No cranial nerve deficit. Coordination normal.   Skin: Skin is warm and dry. No rash noted. She is not diaphoretic. No erythema.   Psychiatric: She has a normal mood and affect. Her behavior is normal. Judgment and thought content normal.   Nursing note and vitals reviewed.                PAST HISTORY        Primary Care Provider: Roni Bread, MD        PMH/PSH:    .     Past Medical History:   Diagnosis Date   . Depression     on prozac many yrs stable sees therapist   . Hard to intubate    . Health care maintenance      colonscopy 1/08-5 yr f/u dr Durwin Nora  pap-1/12  mammo-, dexa-1/12,mammo 3/13  tetanius shot 3/12, with MMR and hep A/B  had pneumovax age 68  NL CXR 05/30/11   . History of colonoscopy 2007    MGM colon cancer 70  nl colonscopy 2007 dr Durwin Nora   . Hyperlipidemia     sees cards dr. Neomia Dear  9/12 chol 274 trigs 225 H53/L176 had myositis with statins,niacin-now on zetia had nl carotid duplex/thallium 1/12 Nl thallium-6/13  11/13 chol 260 H63/L173,ck= 305,cmp-nl creat 0.95-Referred to rheum 5/`14 re elevated cpk Saw Dr Neomia Dear 9/14-myalgia gone ,off tricor,but ck still even higher-echo/thallium ordred   . Hypertension    . Osteoporosis     o'penia had been on reclast per gyn and ergocalciferol early 2012. dexa 1/12-T scores -1.1, -0.5   . Restless legs syndrome    . Routine history and physical examination of adult 02/2010    last Tetanus shot 2012   . Thyroid disease     hypothyroid on synthroid       She has a past surgical history that includes Nasal septum surgery; maxillofacial (1981); Eye surgery (2011); Hysterectomy; and EGD (N/A, 08/28/2016).      Social/Family History:      She reports that she has never smoked. She has never used smokeless tobacco. She reports that she drinks alcohol. Her drug history is not on file.    Family History   Problem Relation Age of Onset   . Osteoporosis Mother    . COPD Mother    . Diabetes Mother    . Arthritis Mother    . Cancer Father    . Cancer Paternal Grandfather          Listed Medications on Arrival:    .     Home Medications     Med List Status:  Complete Set By: Iran Ouch., RN at 09/22/2016  5:31 PM                Alirocumab (PRALUENT) 150 MG/ML Solution Pen-injector     Inject into the skin.     Cholecalciferol (VITAMIN D) 1000 UNITS capsule     Take 1,000 Units by mouth daily.       co-enzyme Q-10 30 MG capsule     Take 30 mg  by mouth 3 (three) times daily.       desoximetasone (TOPICORT) 0.05 % cream     Apply topically 2 (two) times daily.       ezetimibe  (ZETIA) 10 MG tablet     Take 10 mg by mouth daily.       fenofibrate (TRICOR) 145 MG tablet     Take 145 mg by mouth daily.       FLUoxetine (PROZAC) 10 MG tablet     Take 30 mg by mouth daily.         fluticasone (FLONASE) 50 MCG/ACT nasal spray     INSTILL SPRAY IN NOSTRIL TWICE A DAY     gabapentin (NEURONTIN) 300 MG capsule     TAKE 1 TABLET AT NIGHT     glucosamine-chondroitin 500-400 MG tablet     Take 1 tablet by mouth 3 (three) times daily.     levothyroxine (SYNTHROID, LEVOTHROID) 75 MCG tablet     TAKE 1 TABLET BY MOUTH DAILY     Multiple Vitamins-Minerals (MULTIVITAMIN WITH MINERALS) tablet     Take 1 tablet by mouth daily.     Omega-3 Fatty Acids (OMEGA-3 FISH OIL) 500 MG Cap     Take by mouth.     pantoprazole (PROTONIX) 40 MG tablet     Take 1 tablet (40 mg total) by mouth every morning before breakfast.     pramipexole (MIRAPEX) 0.25 MG tablet     Take 0.25 mg by mouth nightly.     White Petrolatum-Mineral Oil (SYSTANE NIGHTTIME) Ointment     APPLY 1/4'' STRIP INTO BOTH EYES ONCE OR TWICE A DAY         Allergies: She is allergic to erythromycin; neosporin [neomycin-bacitracin zn-polymyx]; niacin; and sulfa drugs cross reactors.            VISIT INFORMATION        Clinical Course in the ED:        5:13 PM  Pt not available for exam, in CT.    6:07 PM  Discussed with interventional neurology on call for Dr. Earvin Hansen - they will contact her for follow up         Medications Given in the ED:    .     ED Medication Orders     None            Procedures:      Procedures      Interpretations:                     RESULTS        Lab Results:      Results     Procedure Component Value Units Date/Time    Sedimentation Rate (ESR), Manual [[id number]] Collected:  09/22/16 1805    Specimen:  Blood Updated:  09/22/16 1917     Sed Rate 11 mm/Hr               Radiology Results:      CT Head without Contrast   Final Result      1.  Negative CT head.          Einar Pheasant, MD    09/22/2016 5:48 PM                   Scribe Attestation:      I was acting as a Neurosurgeon for Lyn Henri, MD on [place],[facility]  Treatment  Team: Scribe: Maryla Morrow    I am the first provider for this patient and I personally performed the services documented. Treatment Team: Scribe: Maryla Morrow is scribing for me on Milo,[place] MICHELLE. This note accurately reflects work and decisions made by me.  Lyn Henri, MD            Lyn Henri, MD  09/22/16 [phone]

## 2016-09-22 NOTE — Discharge Instructions (Signed)
Headache    You have been treated for a headache.    Headaches are very common. Most of the time they are benign (not harmful). Some headaches can be very serious. Your headache appears to be benign. The doctor feels it is OK for you to go home.    If you continue to have headaches, or if this headache does not resolve over the next few days, you should be evaluated by your regular doctor or a neurologist. Keep a "headache diary." This may help your doctor learn the cause of your headaches.    Take your headache medication as directed. This is especially important if your doctor has placed you on a daily medication to prevent headaches.    YOU SHOULD SEEK MEDICAL ATTENTION IMMEDIATELY, EITHER HERE OR AT THE NEAREST EMERGENCY DEPARTMENT, IF ANY OF THE FOLLOWING OCCURS:   Your headache gets worse.   You have a severe headache that occurs suddenly.   Your head pain is different from your normal headache.   You have a fever (temperature higher than 100.67F / 38C), especially with a stiff neck.   You feel numbness, tingling, or weakness in your arms or legs.   You pass out.   You have problems with your vision.   You vomit and have trouble taking medication or keeping it down.     Dear Tracy [name]:    Thank you for choosing the [facility] Emergency Department, the premier emergency department in the [place] area.  I hope your visit today was EXCELLENT.    Specific instructions for your visit today:    Your Head CT showed no new acute abnormalities  Follow up with your Primary doctor and neurologist  Dr Johny Shock team will call you on Monday as well to help arrange follow up    If you do not continue to improve or your condition worsens, please contact your doctor or return immediately to the Emergency Department.    Sincerely,  Lyn Henri, MD  Attending Emergency Physician  [facility] Emergency Department    ONSITE PHARMACY  Our full service onsite pharmacy is located in the ER  waiting room.  Open 7 days a week from 9 am to 11 pm.  We accept all major insurances and prices are competitive with major retailers.  Ask your provider to print your prescriptions down to the pharmacy to speed you on your way home.    OBTAINING A PRIMARY CARE APPOINTMENT    Primary care physicians (PCPs, also known as primary care doctors) are either internists or family medicine doctors. Both types of PCPs focus on health promotion, disease prevention, patient education and counseling, and treatment of acute and chronic medical conditions.    Call for an appointment with a primary care doctor.  Ask to see who is taking new patients.     [facility]  telephone:  [phone]  [email or web address]    DOCTOR REFERRALS  Call [phone] (available 24 hours a day, 7 days a week) if you need any further referrals and we can help you find a primary care doctor or specialist.  Also, available online at:  [email or web address]    YOUR CONTACT INFORMATION  Before leaving please check with registration to make sure we have an up-to-date contact number.  You can call registration at [phone] to update your information.  For questions about your hospital bill, please call [phone].  For questions about your Emergency Dept Physician bill please call [phone].  [place]  If you need help with health or social services, please call 2-1-1 for a free referral to resources in your area.  2-1-1 is a free service connecting people with information on health insurance, free clinics, pregnancy, mental health, dental care, food assistance, housing, and substance abuse counseling.  Also, available online at:  http://www.[place].org    MEDICAL RECORDS AND TESTS  Certain laboratory test results do not come back the same day, for example urine cultures.   We will contact you if other important findings are noted.  Radiology films are often reviewed again to ensure accuracy.  If  there is any discrepancy, we will notify you.      Please call [phone] to pick up a complimentary CD of any radiology studies performed.  If you or your doctor would like to request a copy of your medical records, please call [phone].      ORTHOPEDIC INJURY   Please know that significant injuries can exist even when an initial x-ray is read as normal or negative.  This can occur because some fractures (broken bones) are not initially visible on x-rays.  For this reason, close outpatient follow-up with your primary care doctor or bone specialist (orthopedist) is required.    MEDICATIONS AND FOLLOWUP  Please be aware that some prescription medications can cause drowsiness.  Use caution when driving or operating machinery.    The examination and treatment you have received in our Emergency Department is provided on an emergency basis, and is not intended to be a substitute for your primary care physician.  It is important that your doctor checks you again and that you report any new or remaining problems at that time.      [place]  The nearest 24 hour pharmacy is:    [facility] [place] [place] [address]  [place] [address]  [facility])  Call to start or finish an application, compare plans, enroll or ask a question.  [place]: [phone]  Web:  Healthcare.gov    Help Enrolling in [place]  9307461892 (TOLL-FREE)  [phone] (TTY)  Web:  Http://www.coverva.org    Local Help Enrolling in the [place]  [phone] (MAIN)  Email:  health-help'@nvfs'$ .org  Web:  http://lewis-perez.info/  Address:  [id number] [place], [place] [address]    SEDATING MEDICATIONS  Sedating medications include strong pain medications (e.g. narcotics), muscle relaxers, benzodiazepines (used for anxiety and as muscle relaxers), Benadryl/diphenhydramine and other  antihistamines for allergic reactions/itching, and other medications.  If you are unsure if you have received a sedating medication, please ask your physician or nurse.  If you received a sedating medication: DO NOT drive a car. DO NOT operate machinery. DO NOT perform jobs where you need to be alert.  DO NOT drink alcoholic beverages while taking this medicine.     If you get dizzy, sit or lie down at the first signs. Be careful going up and down stairs.  Be extra careful to prevent falls.     Never give this medicine to others.     Keep this medicine out of reach of children.     Do not take or save old medicines. Throw them away when outdated.     Keep all medicines in a cool, dry place. DO NOT keep them in  your bathroom medicine cabinet or in a cabinet above the stove.    MEDICATION REFILLS  Please be aware that we cannot refill any prescriptions through the ER. If you need further treatment from what is provided at your ER visit, please follow up with your primary care doctor or your pain management specialist.    [place]  Did you know [facility] has two freestanding ERs located just a few miles away?  [facility] [place] have short wait times, easy free parking directly in front of the building and top patient satisfaction scores - and the same Board Certified Emergency Medicine doctors as [facility].

## 2016-12-04 ENCOUNTER — Other Ambulatory Visit: Payer: Self-pay

## 2016-12-04 DIAGNOSIS — R1032 Left lower quadrant pain: Secondary | ICD-10-CM

## 2017-03-02 ENCOUNTER — Other Ambulatory Visit: Payer: Self-pay | Admitting: Specialist

## 2017-03-02 DIAGNOSIS — N319 Neuromuscular dysfunction of bladder, unspecified: Secondary | ICD-10-CM

## 2017-03-02 DIAGNOSIS — I609 Nontraumatic subarachnoid hemorrhage, unspecified: Secondary | ICD-10-CM

## 2017-03-09 ENCOUNTER — Ambulatory Visit: Payer: BLUE CROSS/BLUE SHIELD

## 2017-03-09 DIAGNOSIS — N319 Neuromuscular dysfunction of bladder, unspecified: Secondary | ICD-10-CM

## 2017-03-09 DIAGNOSIS — I609 Nontraumatic subarachnoid hemorrhage, unspecified: Secondary | ICD-10-CM

## 2017-05-11 ENCOUNTER — Telehealth: Payer: BLUE CROSS/BLUE SHIELD

## 2017-05-11 NOTE — Pre-Procedure Instructions (Signed)
Difficult Intubation    Cefazolin IV Pre Op (PCN allergy)  T & C 1 Unit PRBC's DOS    Dr. Alois Cliche [phone] : Cardiac Note, EKG, Recent tests    Dr. Genice Rouge [phone] : Neurology Clearance

## 2017-05-22 ENCOUNTER — Ambulatory Visit: Payer: BLUE CROSS/BLUE SHIELD

## 2017-05-24 ENCOUNTER — Ambulatory Visit: Payer: BLUE CROSS/BLUE SHIELD | Attending: Orthopaedic Surgery

## 2017-05-24 DIAGNOSIS — M1612 Unilateral primary osteoarthritis, left hip: Secondary | ICD-10-CM | POA: Insufficient documentation

## 2017-05-24 LAB — PT AND APTT
PT INR: 1 (ref 0.9–1.1)
PT: 13 s (ref 12.6–15.0)
PTT: 29 s (ref 23–37)

## 2017-05-24 LAB — COMPREHENSIVE METABOLIC PANEL
ALT: 23 U/L (ref 0–55)
AST (SGOT): 20 U/L (ref 5–34)
Albumin/Globulin Ratio: 1.4 (ref 0.9–2.2)
Albumin: 3.8 g/dL (ref 3.5–5.0)
Alkaline Phosphatase: 61 U/L (ref 37–106)
Anion Gap: 8 (ref 5.0–15.0)
BUN: 14 mg/dL (ref 7–19)
Bilirubin, Total: 0.5 mg/dL (ref 0.2–1.2)
CO2: 24 mEq/L (ref 22–29)
Calcium: 8.9 mg/dL (ref 8.5–10.5)
Chloride: 109 mEq/L (ref 100–111)
Creatinine: 0.8 mg/dL (ref 0.6–1.0)
Globulin: 2.7 g/dL (ref 2.0–3.6)
Glucose: 87 mg/dL (ref 70–100)
Potassium: 4 mEq/L (ref 3.5–5.1)
Protein, Total: 6.5 g/dL (ref 6.0–8.3)
Sodium: 141 mEq/L (ref 136–145)

## 2017-05-24 LAB — URINALYSIS REFLEX TO MICROSCOPIC EXAM - REFLEX TO CULTURE
Bilirubin, UA: NEGATIVE
Blood, UA: NEGATIVE
Glucose, UA: NEGATIVE
Ketones UA: NEGATIVE
Leukocyte Esterase, UA: NEGATIVE
Nitrite, UA: NEGATIVE
Protein, UR: NEGATIVE
Specific Gravity UA: 1.015 (ref 1.001–1.035)
Urine pH: 6 (ref 5.0–8.0)
Urobilinogen, UA: NEGATIVE mg/dL

## 2017-05-24 LAB — GFR: EGFR: >60

## 2017-05-24 LAB — CBC AND DIFFERENTIAL
Absolute NRBC: 0 10*3/uL (ref 0.00–0.00)
Basophils Absolute Automated: 0.05 10*3/uL (ref 0.00–0.08)
Basophils Automated: 0.7 %
Eosinophils Absolute Automated: 0.25 10*3/uL (ref 0.00–0.44)
Eosinophils Automated: 3.6 %
Hematocrit: 41.6 % (ref 34.7–43.7)
Hgb: 13.7 g/dL (ref 11.4–14.8)
Immature Granulocytes Absolute: 0.02 10*3/uL (ref 0.00–0.07)
Immature Granulocytes: 0.3 %
Lymphocytes Absolute Automated: 1.82 10*3/uL (ref 0.42–3.22)
Lymphocytes Automated: 25.9 %
MCH: 27.8 pg (ref 25.1–33.5)
MCHC: 32.9 g/dL (ref 31.5–35.8)
MCV: 84.6 fL (ref 78.0–96.0)
MPV: 9.2 fL (ref 8.9–12.5)
Monocytes Absolute Automated: 0.53 10*3/uL (ref 0.21–0.85)
Monocytes: 7.5 %
Neutrophils Absolute: 4.36 10*3/uL (ref 1.10–6.33)
Neutrophils: 62 %
Nucleated RBC: 0 /100 WBC (ref 0.0–0.0)
Platelets: 285 10*3/uL (ref 142–346)
RBC: 4.92 10*6/uL (ref 3.90–5.10)
RDW: 14 % (ref 11–15)
WBC: 7.03 10*3/uL (ref 3.10–9.50)

## 2017-05-24 LAB — ABO/RH: ABO Rh: A POS

## 2017-05-24 LAB — ANTIBODY SCREEN: AB Screen Gel: NEGATIVE

## 2017-05-24 NOTE — Consults (Addendum)
Consults   CONSULTATION    Date Time:05/24/2017 11:44 AM  Patient Name: [name],[name]  Requesting Physician: Tracy [name]       Reason for Consultation:   Medical clearance for surgery.      History:   Today I am seeing patient @NAME  who is a 67 y.o. female for a pre-operative appointment per the request of Dr. Ahmed [name] .  The patient is scheduled for an elective Left THA for worsening joint pain.  Patient with left hip osteoarthritis times a year or more.  Currently not relieved by pain medications and requiring surgical intervention.  Patient with decreased mobility and activities of daily living.  Affects her ambulation.  Patient uses a cane for ambulation.  Patient with osteoporosis hypothyroidism hyperlipidemia and asthma all of which is stable conditions.  Also with history of subarachnoid hemorrhage which is stable and patient has received a neurological clearance to proceed with surgery.  She feels well overall without any complaints. METS score>4.    Chief Complaint:   No chief complaint on file.      Problem List:     Patient Active Problem List   Diagnosis   . Thyroid disease   . Depression   . Hyperlipidemia   . Health care maintenance   . Osteoporosis   . History of colonoscopy   . SDH (subdural hematoma)   . Subarachnoid hemorrhage   . SAH (subarachnoid hemorrhage)   . Dysphagia       Past Medical History:     Past Medical History:   Diagnosis Date   . Anxiety    . Arthritis    . Asthma    . Bilateral cataracts     early stages   . Depression     on prozac many yrs stable sees therapist   . Difficulty walking    . Diverticulitis    . Dysphagia    . Dyspnea on exertion    . Ear, nose and throat disorder     seasonal allergies   . Eczema    . Gastroesophageal reflux disease    . Hard to intubate    . Health care maintenance     colonscopy 1/08-5 yr f/u dr Durwin Nora  pap-1/12  mammo-, dexa-1/12,mammo 3/13  tetanius shot 3/12, with MMR and hep A/B  had pneumovax age 43  NL CXR 05/30/11   . History of colonoscopy  2007    MGM colon cancer 70  nl colonscopy 2007 dr Durwin Nora   . Hyperlipidemia     sees cards dr. Neomia Dear  9/12 chol 274 trigs 225 H53/L176 had myositis with statins,niacin-now on zetia had nl carotid duplex/thallium 1/12 Nl thallium-6/13  11/13 chol 260 H63/L173,ck= 305,cmp-nl creat 0.95-Referred to rheum 5/`14 re elevated cpk Saw Dr Neomia Dear 9/14-myalgia gone ,off tricor,but ck still even higher-echo/thallium ordred   . Hypertension    . Hypothyroidism    . Osteoporosis     o'penia had been on reclast per gyn and ergocalciferol early 2012. dexa 1/12-T scores -1.1, -0.5   . Restless legs syndrome    . Routine history and physical examination of adult 02/2010    last Tetanus shot 2012   . SAH (subarachnoid hemorrhage)    . Syncope and collapse    . Thyroid disease     hypothyroid on synthroid   . Urinary tract infection        Past Surgical History:     Past Surgical History:   Procedure Laterality Date   .  COLONOSCOPY     . EGD N/A 08/28/2016    Procedure: EGD;  Surgeon: Lestine Mount, MD;  Location: [facility];  Service: Gastroenterology;  Laterality: N/A;   . EYE SURGERY  2011   . HYSTERECTOMY      complete laparoscopic hyterectomy,rectocele/cystocele repair 09/12/11 in [place]   . maxillofacial  1981   . NASAL SEPTUM SURGERY         Family History:     Family History   Problem Relation Age of Onset   . Osteoporosis Mother    . COPD Mother    . Diabetes Mother    . Arthritis Mother    . Cancer Father    . Cancer Paternal Grandfather        Social History:     Social History     Social History   . Marital status: Single     Spouse name: N/A   . Number of children: N/A   . Years of education: N/A     Occupational History   . Not on file.     Social History Main Topics   . Smoking status: Never Smoker   . Smokeless tobacco: Never Used   . Alcohol use Yes      Comment: rarely   . Drug use: No   . Sexual activity: Not on file     Other Topics Concern   . Not on file     Social History Narrative   . No narrative on file        Allergies:     Allergies   Allergen Reactions   . Fenofibrate Swelling   . Niacin Swelling   . Onion Other (See Comments)     Gi distress   . Peppers Other (See Comments)     GI distress   . Sulfa Antibiotics Hives   . Sulfa Drugs Cross Reactors Hives   . Colesevelam Swelling   . Erythromycin Rash   . Neosporin [Neomycin-Bacitracin Zn-Polymyx] Rash   . Penicillins Rash       Medications:     Prior to Admission medications    Medication Sig Start Date End Date Taking? Authorizing Provider   acetaminophen (TYLENOL) 650 MG CR tablet Take 650 mg by mouth every 8 (eight) hours as needed for Pain.    [provider]   Alirocumab (PRALUENT) 150 MG/ML Solution Pen-injector Inject into the skin every 14 (fourteen) days.        [provider]   Cholecalciferol (VITAMIN D) 1000 UNITS capsule Take 1,000 Units by mouth daily.      [provider]   co-enzyme Q-10 30 MG capsule Take 30 mg by mouth daily as needed        [provider]   desoximetasone (TOPICORT) 0.05 % cream Apply topically 2 (two) times daily.      [provider]   ezetimibe (ZETIA) 10 MG tablet Take 10 mg by mouth daily.      [provider]   FLUoxetine (PROZAC) 10 MG tablet Take 30 mg by mouth daily.        [provider]   FLUoxetine (PROZAC) 20 MG capsule Take 20 mg by mouth daily.    [provider]   fluticasone (FLONASE) 50 MCG/ACT nasal spray INSTILL SPRAY IN NOSTRIL TWICE A DAY 01/16/16   [provider]   gabapentin (NEURONTIN) 300 MG capsule TAKE 1 TABLET AT NIGHT 01/19/16   [provider]  glucosamine-chondroitin 500-400 MG tablet Take 1 tablet by mouth 3 (three) times daily as needed        [provider]   levothyroxine (SYNTHROID, LEVOTHROID) 88 MCG tablet Take 88 mcg by mouth Once a day at 6:00am.    [provider]   Multiple Vitamins-Minerals (MULTIVITAMIN WITH MINERALS) tablet Take 1 tablet by mouth daily.    [provider]   ofloxacin (OCUFLOX) 0.3 % ophthalmic solution Place 1 drop into both eyes daily as needed    [provider]   Omega-3 Fatty Acids (OMEGA-3 FISH OIL) 500 MG Cap Take by mouth daily as needed        [provider]   oxyCODONE-acetaminophen (PERCOCET) 5-325 MG per tablet Take 1 tablet by mouth every 6 (six) hours as needed        [provider]   pantoprazole (PROTONIX) 40 MG tablet Take 1 tablet (40 mg total) by mouth every morning before breakfast. 08/28/16   Les Pou, MD   pramipexole (MIRAPEX) 0.25 MG tablet Take 0.75 mg by mouth nightly        [provider]   TURMERIC PO Take by mouth.    [provider]   VITAMIN E PO Take by mouth daily as needed        [provider]   White Petrolatum-Mineral Oil (SYSTANE NIGHTTIME) Ointment APPLY 1/4'' STRIP INTO BOTH EYES ONCE OR TWICE A DAY 11/18/15   [provider]       Review of Systems:   A comprehensive review of systems was obtained from chart review and the patient.    General ROS: negative for - malaise and fatigue  Psychological ROS: negative for - disorientation or suicidal ideation  Ophthalmic ROS: negative for - blurry vision  ENT ROS: negative for - headaches  Allergy and Immunology ROS: negative  Hematological and Lymphatic ROS: negative for - bleeding problems  Endocrine ROS: negative for - malaise/lethargy  Respiratory ROS: no cough, shortness of breath, or wheezing  Cardiovascular ROS: no chest pain or dyspnea on exertion  Gastrointestinal ROS: no abdominal pain, change in bowel habits, or black or bloody stools  Genito-Urinary ROS: no dysuria, trouble voiding, or hematuria  Musculoskeletal ROS: positive for - joint pain  Neurological ROS: no TIA or stroke symptoms      Physical Exam:     Vitals:    05/24/17 0700   BP: 126/69   Pulse: (!) 50   Temp: (!) 96.6 F (35.9 C)   SpO2: 97%       General appearance - alert, well appearing, and in no distress  Mental status -  alert, oriented to person, place, and time, affect appropriate to mood  Nose - normal, no discharge  Mouth - mucous membranes moist,   Neck - supple, no significant adenopathy  Chest - clear to auscultation, no wheezes, rales or rhonchi, symmetric air entry  Heart - normal rate, regular rhythm, normal S1, S2, no murmurs, rubs, or gallops  Abdomen - soft, nontender, nondistended, no organomegaly  Neurological - alert, oriented, normal speech, no focal findings  Musculoskeletal - Left Hip OA changes with tenderness and painful ROM  Extremities - peripheral pulses normal, no pedal edema    Labs:     Results     Procedure Component Value Units Date/Time    Urinalysis Reflex to Microscopic Exam - Reflex to Culture-Adult [[id number]] Collected:  05/24/17 4098     Updated:  05/24/17 1191  Urine Type Urine, Clean Ca     Color, UA Yellow     Clarity, UA Clear     Specific Gravity UA 1.015     Urine pH 6.0     Leukocyte Esterase, UA Negative     Nitrite, UA Negative     Protein, UR Negative     Glucose, UA Negative     Ketones UA Negative     Urobilinogen, UA Negative mg/dL      Bilirubin, UA Negative     Blood, UA Negative     RBC, UA 3 - 5 /hpf      Squamous Epithelial Cells, Urine 0 - 5 /hpf     Narrative:       Per protocol  Replace urinary catheter prior to obtaining the urine culture  if it has been in place for greater than or equal to 14  days:->No  Indications for U/A Reflex to Micro - Reflex to  Culture:->Other (please specify in Comments)    ABO/Rh [[id number]] Collected:  05/24/17 0857    Specimen:  Blood Updated:  05/24/17 0953     ABO Rh A POS    Antibody Screen [[id number]] Collected:  05/24/17 0857    Specimen:  Blood Updated:  05/24/17 0953     AB Screen Gel NEG    Comprehensive metabolic panel [[id number]] Collected:  05/24/17 0857    Specimen:  Blood Updated:  05/24/17 0943     Glucose 87 mg/dL      BUN 14 mg/dL      Creatinine 0.8 mg/dL      Sodium 086 mEq/L      Potassium 4.0 mEq/L      Chloride 109 mEq/L       CO2 24 mEq/L      Calcium 8.9 mg/dL      Protein, Total 6.5 g/dL      Albumin 3.8 g/dL      AST (SGOT) 20 U/L      ALT 23 U/L      Alkaline Phosphatase 61 U/L      Bilirubin, Total 0.5 mg/dL      Globulin 2.7 g/dL      Albumin/Globulin Ratio 1.4     Anion Gap 8.0    GFR [[id number]] Collected:  05/24/17 0857     Updated:  05/24/17 0943     EGFR >60.0    PT/APTT [[id number]] Collected:  05/24/17 0857     Updated:  05/24/17 0927     PT 13.0 sec      PT INR 1.0     PT Anticoag. Given Within 48 hrs. argatroban     PTT 29 sec     CBC and differential [[id number]] Collected:  05/24/17 0857    Specimen:  Blood from Blood Updated:  05/24/17 0921     WBC 7.03 x10 3/uL      Hgb 13.7 g/dL      Hematocrit 64.4 %      Platelets 285 x10 3/uL      RBC 4.92 x10 6/uL      MCV 84.6 fL      MCH 27.8 pg      MCHC 32.9 g/dL      RDW 14 %      MPV 9.2 fL      Neutrophils 62.0 %      Lymphocytes Automated 25.9 %      Monocytes 7.5 %      Eosinophils Automated 3.6 %  Basophils Automated 0.7 %      Immature Granulocyte 0.3 %      Nucleated RBC 0.0 /100 WBC      Neutrophils Absolute 4.36 x10 3/uL      Abs Lymph Automated 1.82 x10 3/uL      Abs Mono Automated 0.53 x10 3/uL      Abs Eos Automated 0.25 x10 3/uL      Absolute Baso Automated 0.05 x10 3/uL      Absolute Immature Granulocyte 0.02 x10 3/uL      Absolute NRBC 0.00 x10 3/uL     Presurgical Surveillance, MSSA+MRSA [[id number]] Collected:  05/24/17 0857    Specimen:  Throat Updated:  05/24/17 0857    Presurgical Surveillance, MSSA+MRSA [[id number]] Collected:  05/24/17 0857    Specimen:  Nares Updated:  05/24/17 0857          Rads:     Radiology Results (24 Hour)     ** No results found for the last 24 hours. **          EKG: Normal.    Assessment/Plan:   Pre-operative Evaluation.  Osteoarthritis of Left Hip.  Patient has no cardiopulmonary contraindication to surgery.  Patient is a low surgical risk.  History of Asthma. Continue outpatient medications.  Hypothyroidism - plan to  continue current medication.  History of Depression/Anxiety-Disorder. Continue current medication.  HLD- cont meds  Osteoporosis-continue meds  Restless leg syndrome-patient on Mirapex  GERD continue PPI   History of subarachnoid hemorrhage-patient was seen by her neurologist and neurological clearance was given.  Home meds reviewed and the patient was advised which medications to take the am of surgery.  No ASA/NSAID's, OTC Meds 10 days prior to surgery as per preop instructions.DVT and GI prophylaxis per orthopedics. Med Rec completed for post-op today and discussed with patient. Pt is medically cleared. Will follow patient post-operatively. Thank you for consultation.    Additional Recommendations:   CBC/BMP POD #1    Documentation available for the review of Dr. Ahmed [name]     Signed NW:GNFAOZHY Darrel Reach, MD

## 2017-05-24 NOTE — PT Plan of Care Note (Signed)
[facility]  [address]  [place] [address]  [phone]    Physical Therapy Pre-Operative Intake Form    Patient: Tracy [name] MRN: [id number]   Unit: [facility] PER[facility]     Precautions  Total Hip Replacement:  (Left Hip Replacement by Dr. Ahmed Prima on 06/06/17)    Social History   Prior Level of Function  Prior level of function: Independent with ADLs;Ambulates with assistive device (sittin down)  Assistive Device: Single point cane  Baseline Activity Level: Community ambulation  Driving: independent  Cooking: microwave only  Employment: FT  DME Currently at Home: Single point cane;Front wheel walker (step stool w/ handle)    Home Living Arrangements  Living Arrangements: Alone (sisiter to stay post op x2wks)  Type of Home: House  Home Layout: Two level;Able to live on main level with bedroom/bathroom (4+4+1 STE w/ railing:)  Bathroom Shower/Tub: Tub/shower unit  Bathroom Toilet: Raised  Bathroom Accessibility: Accessible via walker  DME Currently at Home: Single point cane;Front wheel walker (step stool w/ handle)    Subjective    Patient is agreeable to participation in the therapy session.  Patient Goal  Patient Goal:  (walk dog, dance, exercises)    Pain Assessment  Patient's Stated Comfort Functional Goal: 4-moderate pain     Functional Limits  Pain Level Best: 6  Pain Level Worst: 10    Observation of patient:                                                                        Reviewed  post-operative therapy plans.      Plan      Initiate post-op Physical Therapy Evaluation per post-op MD orders.       Signature: Louann Liv, PTA 05/24/2017 8:30 AM

## 2017-05-24 NOTE — Progress Notes (Signed)
[facility], 06/06/17     05/24/17 0759   Healthcare Decisions   Interviewed: Patient   Orientation/Decision Making Abilities of Patient Alert and Oriented x3, able to make decisions   Prior to admission   Prior level of function Independent with ADLs;Ambulates with assistive device   Type of Residence Private residence   Home Layout One level;Stairs to enter without rails (add number in comment)  (8ste)   Have running water, electricity, heat, etc? Yes   Living Arrangements Alone  (sisiter to stay post op x2wks)   Discharge Planning   Support Systems Family members   Patient expects to be discharged to: home with ? HH  (pt does not have a smart phone)   Anticipated [place] plan discussed with: Same as interviewed

## 2017-05-24 NOTE — Plan of Care (Signed)
Problem: Hip Surgery  Goal: Patient/Patient Care Companion demonstrates understanding of disease process, treatment plan, medications, and discharge plan  Patient states lifestyle goals are"to walk, exercise, dance."  Continue treatment plan, offer resources as appropriate.

## 2017-05-28 LAB — ECG 12-LEAD
Atrial Rate: 70 {beats}/min
P Axis: 13 degrees
P-R Interval: 148 ms
Q-T Interval: 428 ms
QRS Duration: 68 ms
QTC Calculation (Bezet): 462 ms
R Axis: 9 degrees
T Axis: 29 degrees
Ventricular Rate: 70 {beats}/min

## 2017-06-15 ENCOUNTER — Telehealth: Payer: BLUE CROSS/BLUE SHIELD

## 2017-06-15 NOTE — Pre-Procedure Instructions (Signed)
T&C 1 unit  Cefazolin  Clinic appt 5-7  Neurology clearance, last office note requested from Dr. Patricia Nettle [phone]  Chart sent for anesthesia review; difficult intubation and medical hx.

## 2017-06-26 ENCOUNTER — Ambulatory Visit: Payer: BLUE CROSS/BLUE SHIELD

## 2017-06-26 NOTE — Consults (Signed)
Consults   CONSULTATION    Date Time:06/26/2017 10:37 AM  Patient Name: Calderwood,Karishma MICHELLE  Requesting Physician: Goyal      Reason for Consultation:   Medical clearance for surgery.      History:   Today I am seeing patient Teaira Michelle Burditt who is a 67 y.o. female for a pre-operative appointment per the request of Dr. Goyal.  The patient is scheduled for an elective Left THA for worsening joint pain.   Patient with left hip pain-osteoarthritis x a year or more that has progressively gotten worse.  No relief with pain medication at this time.  Causing difficulty with ambulation and activities of daily living.  Now requiring surgical intervention.  Patient with history of osteoporosis hypothyroidism hyperlipidemia and asthma  however stable at this time.  Patient denies any chest pain or cardiac complaints.  Denies any shortness of breath or respiratory complaints.  No other associated symptoms reported.  Patient on medical management and tolerating without any complications.Also with history of subarachnoid hemorrhage which is stable and patient has received a neurological clearance to proceed with surgery.  She feels well overall without any complaints. METS score>4.      Chief Complaint:   No chief complaint on file.      Problem List:     Patient Active Problem List   Diagnosis   . Thyroid disease   . Depression   . Hyperlipidemia   . Health care maintenance   . Osteoporosis   . History of colonoscopy   . SDH (subdural hematoma)   . Subarachnoid hemorrhage   . SAH (subarachnoid hemorrhage)   . Dysphagia       Past Medical History:     Past Medical History:   Diagnosis Date   . Anxiety    . Arthritis    . Asthma    . Bilateral cataracts     early stages   . Depression     on prozac many yrs stable sees therapist   . Difficulty walking    . Diverticulitis    . Dysphagia    . Dyspnea on exertion    . Ear, nose and throat disorder     seasonal allergies   . Eczema    . Gastroesophageal reflux disease    . Hard to  intubate    . Health care maintenance     colonscopy 1/08-5 yr f/u dr barkin  pap-1/12  mammo-, dexa-1/12,mammo 3/13  tetanius shot 3/12, with MMR and hep A/B  had pneumovax age 58  NL CXR 05/30/11   . History of colonoscopy 2007    MGM colon cancer 70  nl colonscopy 2007 dr barkin   . Hyperlipidemia     sees cards dr. obrien  9/12 chol 274 trigs 225 H53/L176 had myositis with statins,niacin-now on zetia had nl carotid duplex/thallium 1/12 Nl thallium-6/13  11/13 chol 260 H63/L173,ck= 305,cmp-nl creat 0.95-Referred to rheum 5/`14 re elevated cpk Saw Dr Obrien 9/14-myalgia gone ,off tricor,but ck still even higher-echo/thallium ordred   . Hypertension    . Hypothyroidism    . Osteoporosis     o'penia had been on reclast per gyn and ergocalciferol early 2012. dexa 1/12-T scores -1.1, -0.5   . Restless legs syndrome    . Routine history and physical examination of adult 02/2010    last Tetanus shot 2012   . SAH (subarachnoid hemorrhage)    . Syncope and collapse    . Thyroid disease       hypothyroid on synthroid   . Urinary tract infection        Past Surgical History:     Past Surgical History:   Procedure Laterality Date   . COLONOSCOPY     . EGD N/A 08/28/2016    Procedure: EGD;  Surgeon: Abedi, Mahmood, MD;  Location: [facility];  Service: Gastroenterology;  Laterality: N/A;   . EYE SURGERY  2011   . HYSTERECTOMY      complete laparoscopic hyterectomy,rectocele/cystocele repair 09/12/11 in [place]   . maxillofacial  1981   . NASAL SEPTUM SURGERY         Family History:     Family History   Problem Relation Age of Onset   . Osteoporosis Mother    . COPD Mother    . Diabetes Mother    . Arthritis Mother    . Cancer Father    . Cancer Paternal Grandfather        Social History:     Social History     Social History   . Marital status: Single     Spouse name: N/A   . Number of children: N/A   . Years of education: N/A     Occupational History   . Not on file.     Social History Main Topics   . Smoking status: Never Smoker    . Smokeless tobacco: Never Used   . Alcohol use Yes      Comment: glass of wine once/month   . Drug use: No   . Sexual activity: Not on file     Other Topics Concern   . Not on file     Social History Narrative   . No narrative on file       Allergies:     Allergies   Allergen Reactions   . Fenofibrate Swelling   . Niacin Swelling   . Onion Other (See Comments)     Gi distress   . Peppers Other (See Comments)     GI distress   . Statins      Muscle pain and liver enzymes elevated   . Sulfa Antibiotics Hives   . Sulfa Drugs Cross Reactors Hives   . Colesevelam Rash   . Erythromycin Rash   . Neosporin [Neomycin-Bacitracin Zn-Polymyx] Rash   . Penicillins Rash       Medications:     Prior to Admission medications    Medication Sig Start Date End Date Taking? Authorizing Provider   acetaminophen (TYLENOL) 650 MG CR tablet Take 650 mg by mouth every 8 (eight) hours as needed for Pain.   Yes [provider]   Cholecalciferol (VITAMIN D) 1000 UNITS capsule Take 1,000 Units by mouth every morning       Yes [provider]   co-enzyme Q-10 30 MG capsule Take 30 mg by mouth daily as needed       Yes [provider]   desoximetasone (TOPICORT) 0.05 % cream Apply topically as needed       Yes [provider]   Evolocumab (REPATHA SURECLICK) 140 MG/ML Solution Auto-injector 1 mL Every 2 weeks   Yes [provider]   ezetimibe (ZETIA) 10 MG tablet Take 10 mg by mouth every evening       Yes [provider]   FLUoxetine (PROZAC) 10 MG tablet Take 10 mg by mouth every morning       Yes [provider]   FLUoxetine (PROZAC) 20 MG capsule   Take 20 mg by mouth every morning   Yes [provider]   fluticasone (FLONASE) 50 MCG/ACT nasal spray INSTILL SPRAY IN NOSTRIL TWICE A DAY 01/16/16  Yes [provider]   glucosamine-chondroitin 500-400 MG tablet Take 1 tablet by mouth as needed       Yes [provider]   levothyroxine (SYNTHROID,  LEVOTHROID) 88 MCG tablet Take 88 mcg by mouth every morning       Yes [provider]   Multiple Vitamins-Minerals (MULTIVITAMIN WITH MINERALS) tablet Take 1 tablet by mouth every morning       Yes [provider]   Omega-3 Fatty Acids (OMEGA-3 FISH OIL) 500 MG Cap Take by mouth daily as needed       Yes [provider]   oxyCODONE-acetaminophen (PERCOCET) 5-325 MG per tablet Take 1 tablet by mouth every 6 (six) hours as needed       Yes [provider]   pantoprazole (PROTONIX) 40 MG tablet Take 1 tablet (40 mg total) by mouth every morning before breakfast. 08/28/16  Yes Chaudhry, Amal, MD   pramipexole (MIRAPEX) 0.25 MG tablet Take 0.5 mg by mouth nightly       Yes [provider]   TURMERIC PO Take by mouth every morning       Yes [provider]   VITAMIN E PO Take by mouth every morning       Yes [provider]   White Petrolatum-Mineral Oil (SYSTANE NIGHTTIME) Ointment every evening 11/18/15  Yes [provider]   gabapentin (NEURONTIN) 300 MG capsule TAKE 1 TABLET AT NIGHT 01/19/16   [provider]       Review of Systems:   A comprehensive review of systems was obtained from chart review and the patient.    General ROS: negative for - malaise and fatigue  Psychological ROS: negative for - disorientation or suicidal ideation  Ophthalmic ROS: negative for - blurry vision  ENT ROS: negative for - headaches  Allergy and Immunology ROS: negative  Hematological and Lymphatic ROS: negative for - bleeding problems  Endocrine ROS: negative for - malaise/lethargy  Respiratory ROS: no cough, shortness of breath, or wheezing  Cardiovascular ROS: no chest pain or dyspnea on exertion  Gastrointestinal ROS: no abdominal pain, change in bowel habits, or black or bloody stools  Genito-Urinary ROS: no dysuria, trouble voiding, or hematuria  Musculoskeletal ROS: positive for - joint pain  Neurological ROS: no TIA or stroke symptoms      Physical  Exam:   BP 126/69, heart rate 50, temperature 96.6, saturation 97% on room air.    Constitutional-normal  Eyes-normal   ENMT-normal  CV -normal  Resp-normal  GI-normal  Skin-normal   Neuro-normal  Musculoskeletal - Left Hip OA changes with tenderness and painful ROM      Labs:     Results     ** No results found for the last 24 hours. **      previous labs reviewed and noted.    Rads:     Radiology Results (24 Hour)     ** No results found for the last 24 hours. **          EKG: Normal.    Assessment/Plan:   Pre-operative Evaluation.  Osteoarthritis of Left Hip.  Patient has no cardiopulmonary contraindication to surgery.  Patient is a low surgical risk.  History of Asthma. Continue outpatient medications.  Hyperlipidemia - plan to continue current medication.  Hypothyroidism -   plan to continue current medication.  History of Depression/Anxiety-Disorder. Continue current medication.  Osteoporosis-continue meds  Restless leg syndrome-patient on Mirapex  GERD continue PPI   History of subarachnoid hemorrhage-patient was seen by her neurologist and neurological clearance was given.  Home meds reviewed and the patient was advised which medications to take the am of surgery.  No ASA/NSAID's, OTC Meds 10 days prior to surgery as per preop instructions.DVT and GI prophylaxis per orthopedics. Med Rec completed for post-op today and discussed with patient. Pt is medically cleared. Will follow patient post-operatively. Thank you for consultation.    Additional Recommendations:   CBC/BMP POD #1    Documentation available for the review of Dr. Goyal    Signed by:Frankfort M Luchiano Viscomi, MD

## 2017-06-26 NOTE — Plan of Care (Signed)
Pt's DOS changed, re discussed care of plan, no changes.

## 2017-06-26 NOTE — PT Plan of Care Note (Signed)
[facility]  [address]  [place] [address]  [phone]    Physical Therapy Pre-Operative Intake Form    Patient: Tracy [name] MRN: [id number]   Unit: [facility] PERI[facility]     Precautions  Total Hip Replacement:  (Left Hip Replacement by Dr. Ahmed Prima on 07/11/17)    Social History   Prior Level of Function  Prior level of function: Independent with ADLs;Ambulates with assistive device (sittin down)  Assistive Device: Single point cane  Baseline Activity Level: Community ambulation  Driving: independent  Cooking: microwave only  Employment: FT  DME Currently at Home: Single point cane;Front wheel walker (step stool w/ handle)    Home Living Arrangements  Living Arrangements: Alone (sisiter to stay post op x2wks)  Type of Home: House  Home Layout: Two level;Able to live on main level with bedroom/bathroom (4+4+1 STE w/ railing:)  Bathroom Shower/Tub: Tub/shower unit  Bathroom Toilet: Standard  Bathroom Accessibility: Accessible via walker  DME Currently at Home: Single point cane;Front wheel walker (step stool w/ handle)    Subjective    Patient is agreeable to participation in the therapy session.  Patient Goal  Patient Goal:  (walk dog, dance, exercises, drive motorcycle)    Pain Assessment  Patient's Stated Comfort Functional Goal: 4-moderate pain     Functional Limits  Pain Level Best: 3  Pain Level Worst: 9    Observation of patient:                                                                        Reviewed  post-operative therapy plans.    Additional patient/coach education provided per Education Tab.      Plan      Initiate post-op Physical Therapy Evaluation per post-op MD orders.       Signature: Louann Liv, PTA 06/26/2017 8:31 AM

## 2017-06-27 ENCOUNTER — Ambulatory Visit: Payer: BLUE CROSS/BLUE SHIELD

## 2017-07-11 ENCOUNTER — Inpatient Hospital Stay: Payer: BLUE CROSS/BLUE SHIELD

## 2017-07-11 ENCOUNTER — Encounter
Admission: RE | Disposition: A | Discharge status: Home Health | Payer: Self-pay | Source: Ambulatory Visit | Attending: Orthopaedic Surgery

## 2017-07-11 ENCOUNTER — Inpatient Hospital Stay: Payer: BLUE CROSS/BLUE SHIELD | Admitting: Anesthesiology

## 2017-07-11 ENCOUNTER — Inpatient Hospital Stay
Admission: RE | Admit: 2017-07-11 | Discharge: 2017-07-12 | DRG: 470 | Disposition: A | Discharge status: Home Health | Payer: BLUE CROSS/BLUE SHIELD | Source: Ambulatory Visit

## 2017-07-11 DIAGNOSIS — F419 Anxiety disorder, unspecified: Secondary | ICD-10-CM | POA: Diagnosis present

## 2017-07-11 DIAGNOSIS — M810 Age-related osteoporosis without current pathological fracture: Secondary | ICD-10-CM | POA: Diagnosis present

## 2017-07-11 DIAGNOSIS — M1610 Unilateral primary osteoarthritis, unspecified hip: Secondary | ICD-10-CM | POA: Diagnosis present

## 2017-07-11 DIAGNOSIS — E039 Hypothyroidism, unspecified: Secondary | ICD-10-CM | POA: Diagnosis present

## 2017-07-11 DIAGNOSIS — M1612 Unilateral primary osteoarthritis, left hip: Principal | ICD-10-CM | POA: Diagnosis present

## 2017-07-11 DIAGNOSIS — Z85038 Personal history of other malignant neoplasm of large intestine: Secondary | ICD-10-CM

## 2017-07-11 DIAGNOSIS — K219 Gastro-esophageal reflux disease without esophagitis: Secondary | ICD-10-CM | POA: Diagnosis present

## 2017-07-11 DIAGNOSIS — G2581 Restless legs syndrome: Secondary | ICD-10-CM | POA: Diagnosis present

## 2017-07-11 DIAGNOSIS — J302 Other seasonal allergic rhinitis: Secondary | ICD-10-CM | POA: Diagnosis present

## 2017-07-11 DIAGNOSIS — R1310 Dysphagia, unspecified: Secondary | ICD-10-CM | POA: Diagnosis present

## 2017-07-11 DIAGNOSIS — J45909 Unspecified asthma, uncomplicated: Secondary | ICD-10-CM | POA: Diagnosis present

## 2017-07-11 DIAGNOSIS — E785 Hyperlipidemia, unspecified: Secondary | ICD-10-CM | POA: Diagnosis present

## 2017-07-11 DIAGNOSIS — I10 Essential (primary) hypertension: Secondary | ICD-10-CM | POA: Diagnosis present

## 2017-07-11 DIAGNOSIS — L309 Dermatitis, unspecified: Secondary | ICD-10-CM | POA: Diagnosis present

## 2017-07-11 LAB — RED BLOOD CELLS OR HOLD
Expiration Date: 201906252359
UTYPE: A POS

## 2017-07-11 LAB — TYPE AND SCREEN
AB Screen Gel: NEGATIVE
ABO Rh: A POS

## 2017-07-11 SURGERY — ARTHROPLASTY, HIP TOTAL
Anesthesia: Regional | Site: Hip | Laterality: Left | Wound class: Clean

## 2017-07-11 MED ORDER — HYDROMORPHONE HCL 1 MG/ML IJ SOLN
0.5000 mg | INTRAMUSCULAR | Status: DC | PRN
Start: 2017-07-11 — End: 2017-07-11

## 2017-07-11 MED ORDER — LIDOCAINE HCL (PF) 2 % IJ SOLN
INTRAMUSCULAR | Status: AC
Start: 2017-07-11 — End: ?
  Filled 2017-07-11: qty 5

## 2017-07-11 MED ORDER — LACTATED RINGERS IV SOLN
INTRAVENOUS | Status: DC
Start: 2017-07-11 — End: 2017-07-12

## 2017-07-11 MED ORDER — NITROGLYCERIN 2 % TD OINT
TOPICAL_OINTMENT | TRANSDERMAL | Status: AC
Start: 2017-07-11 — End: ?
  Filled 2017-07-11: qty 1

## 2017-07-11 MED ORDER — MEPERIDINE HCL 50 MG/ML IJ SOLN
25.0000 mg | Freq: Once | INTRAMUSCULAR | Status: DC | PRN
Start: 2017-07-11 — End: 2017-07-11

## 2017-07-11 MED ORDER — MIDAZOLAM HCL 2 MG/2ML IJ SOLN
INTRAMUSCULAR | Status: DC | PRN
Start: 2017-07-11 — End: 2017-07-11
  Administered 2017-07-11: 1 mg via INTRAVENOUS
  Administered 2017-07-11: 2 mg via INTRAVENOUS
  Administered 2017-07-11 (×2): 1 mg via INTRAVENOUS

## 2017-07-11 MED ORDER — PRAMIPEXOLE DIHYDROCHLORIDE 0.25 MG PO TABS
0.75 mg | ORAL_TABLET | Freq: Every evening | ORAL | Status: DC
Start: 2017-07-11 — End: 2017-07-12
  Administered 2017-07-11: 21:00:00 0.75 mg via ORAL
  Filled 2017-07-11: qty 3

## 2017-07-11 MED ORDER — SCOPOLAMINE 1 MG/3DAYS TD PT72
1.0000 | MEDICATED_PATCH | Freq: Once | TRANSDERMAL | Status: DC
Start: 2017-07-11 — End: 2017-07-11
  Administered 2017-07-11: 08:00:00 1 via TRANSDERMAL

## 2017-07-11 MED ORDER — HYDROCODONE-ACETAMINOPHEN 5-325 MG PO TABS
1.0000 | ORAL_TABLET | ORAL | Status: DC | PRN
Start: 2017-07-11 — End: 2017-07-12
  Administered 2017-07-11 – 2017-07-12 (×4): 1 via ORAL
  Filled 2017-07-11 (×4): qty 1

## 2017-07-11 MED ORDER — ONDANSETRON HCL 4 MG/2ML IJ SOLN
4.0000 mg | Freq: Once | INTRAMUSCULAR | Status: DC | PRN
Start: 2017-07-11 — End: 2017-07-11

## 2017-07-11 MED ORDER — BUPIVACAINE HCL (PF) 0.5 % IJ SOLN
INTRAMUSCULAR | Status: DC | PRN
Start: 2017-07-11 — End: 2017-07-11
  Administered 2017-07-11: 12 mg via INTRATHECAL

## 2017-07-11 MED ORDER — PANTOPRAZOLE SODIUM 40 MG PO TBEC
40.00 mg | DELAYED_RELEASE_TABLET | Freq: Every day | ORAL | Status: DC
Start: 2017-07-11 — End: 2017-07-12
  Administered 2017-07-11: 17:00:00 40 mg via ORAL
  Filled 2017-07-11: qty 1

## 2017-07-11 MED ORDER — FENTANYL CITRATE (PF) 50 MCG/ML IJ SOLN (WRAP)
INTRAMUSCULAR | Status: AC
Start: 2017-07-11 — End: 2017-07-11
  Administered 2017-07-11: 11:00:00 50 ug via INTRAVENOUS
  Filled 2017-07-11: qty 2

## 2017-07-11 MED ORDER — DIPHENHYDRAMINE HCL 25 MG PO CAPS
25.0000 mg | ORAL_CAPSULE | Freq: Two times a day (BID) | ORAL | Status: DC | PRN
Start: 2017-07-11 — End: 2017-07-12

## 2017-07-11 MED ORDER — PROMETHAZINE HCL 25 MG/ML IJ SOLN
6.2500 mg | Freq: Four times a day (QID) | INTRAMUSCULAR | Status: DC | PRN
Start: 2017-07-11 — End: 2017-07-12

## 2017-07-11 MED ORDER — POVIDONE-IODINE 5 % OP SOLN
OPHTHALMIC | Status: DC | PRN
Start: 2017-07-11 — End: 2017-07-11
  Administered 2017-07-11: 22.5 mL

## 2017-07-11 MED ORDER — MEPERIDINE HCL 50 MG/ML IJ SOLN
25.0000 mg | Freq: Once | INTRAMUSCULAR | Status: AC | PRN
Start: 2017-07-11 — End: 2017-07-11

## 2017-07-11 MED ORDER — FENTANYL CITRATE (PF) 50 MCG/ML IJ SOLN (WRAP)
50.0000 ug | INTRAMUSCULAR | Status: DC | PRN
Start: 2017-07-11 — End: 2017-07-11

## 2017-07-11 MED ORDER — HETASTARCH-ELECTROLYTES 6 % IV SOLN
INTRAVENOUS | Status: DC | PRN
Start: 2017-07-11 — End: 2017-07-11
  Administered 2017-07-11: 500 mL/h via INTRAVENOUS

## 2017-07-11 MED ORDER — LABETALOL HCL 5 MG/ML IV SOLN (WRAP)
INTRAVENOUS | Status: AC
Start: 2017-07-11 — End: ?
  Filled 2017-07-11: qty 20

## 2017-07-11 MED ORDER — LACTATED RINGERS IV SOLN
100.0000 mL/h | INTRAVENOUS | Status: DC
Start: 2017-07-11 — End: 2017-07-11

## 2017-07-11 MED ORDER — FAMOTIDINE 10 MG/ML IV SOLN (WRAP)
INTRAVENOUS | Status: DC | PRN
Start: 2017-07-11 — End: 2017-07-11
  Administered 2017-07-11: 20 mg via INTRAVENOUS

## 2017-07-11 MED ORDER — PREGABALIN 75 MG PO CAPS
75.0000 mg | ORAL_CAPSULE | ORAL | Status: AC
Start: 2017-07-11 — End: 2017-07-11
  Administered 2017-07-11: 08:00:00 75 mg via ORAL

## 2017-07-11 MED ORDER — DIPHENHYDRAMINE HCL 50 MG/ML IJ SOLN
12.5000 mg | Freq: Four times a day (QID) | INTRAMUSCULAR | Status: DC | PRN
Start: 2017-07-11 — End: 2017-07-11

## 2017-07-11 MED ORDER — ACETAMINOPHEN 500 MG PO TABS
1000.0000 mg | ORAL_TABLET | ORAL | Status: AC
Start: 2017-07-11 — End: 2017-07-11
  Administered 2017-07-11: 08:00:00 1000 mg via ORAL

## 2017-07-11 MED ORDER — FENTANYL CITRATE (PF) 50 MCG/ML IJ SOLN (WRAP)
INTRAMUSCULAR | Status: DC | PRN
Start: 2017-07-11 — End: 2017-07-11
  Administered 2017-07-11: 50 ug via INTRAVENOUS

## 2017-07-11 MED ORDER — VANCOMYCIN 1000 MG IN 250 ML NS IVPB VIAL-MATE (CNR)
15.0000 mg/kg | Freq: Two times a day (BID) | INTRAVENOUS | Status: AC
Start: 2017-07-11 — End: 2017-07-11
  Administered 2017-07-11: 17:00:00 1000 mg via INTRAVENOUS
  Filled 2017-07-11: qty 250

## 2017-07-11 MED ORDER — ONDANSETRON HCL 4 MG/2ML IJ SOLN
INTRAMUSCULAR | Status: AC
Start: 2017-07-11 — End: ?
  Filled 2017-07-11: qty 2

## 2017-07-11 MED ORDER — ONDANSETRON HCL 4 MG/2ML IJ SOLN
4.0000 mg | Freq: Three times a day (TID) | INTRAMUSCULAR | Status: DC | PRN
Start: 2017-07-11 — End: 2017-07-12

## 2017-07-11 MED ORDER — TRANEXAMIC ACID 1000 MG/10ML IV SOLN
1000.0000 mg | Freq: Once | INTRAVENOUS | Status: AC
Start: 2017-07-11 — End: 2017-07-11
  Administered 2017-07-11: 09:00:00 1000 mg via INTRAVENOUS

## 2017-07-11 MED ORDER — FLUTICASONE PROPIONATE 50 MCG/ACT NA SUSP
1.00 | Freq: Every day | NASAL | Status: DC
Start: 2017-07-12 — End: 2017-07-12
  Administered 2017-07-12: 08:00:00 1 via NASAL
  Filled 2017-07-11: qty 16

## 2017-07-11 MED ORDER — PROPOFOL 10 MG/ML IV EMUL (WRAP)
INTRAVENOUS | Status: AC
Start: 2017-07-11 — End: ?
  Filled 2017-07-11: qty 50

## 2017-07-11 MED ORDER — OXYCODONE-ACETAMINOPHEN 5-325 MG PO TABS
1.0000 | ORAL_TABLET | Freq: Once | ORAL | Status: DC | PRN
Start: 2017-07-11 — End: 2017-07-11

## 2017-07-11 MED ORDER — EPHEDRINE SULFATE 50 MG/ML IJ/IV SOLN (WRAP)
Status: DC | PRN
Start: 2017-07-11 — End: 2017-07-11
  Administered 2017-07-11: 50 mg via INTRAMUSCULAR

## 2017-07-11 MED ORDER — LACTATED RINGERS IV SOLN
INTRAVENOUS | Status: DC
Start: 2017-07-11 — End: 2017-07-11

## 2017-07-11 MED ORDER — SODIUM CHLORIDE 0.9 % IV MBP
1000.0000 mg | Freq: Once | INTRAVENOUS | Status: AC
Start: 2017-07-11 — End: 2017-07-11
  Administered 2017-07-11: 11:00:00 1000 mg via INTRAVENOUS

## 2017-07-11 MED ORDER — FENTANYL CITRATE (PF) 50 MCG/ML IJ SOLN (WRAP)
INTRAMUSCULAR | Status: AC
Start: 2017-07-11 — End: ?
  Filled 2017-07-11: qty 2

## 2017-07-11 MED ORDER — LIDOCAINE HCL 2 % IJ SOLN
INTRAMUSCULAR | Status: DC | PRN
Start: 2017-07-11 — End: 2017-07-11
  Administered 2017-07-11: 1.3 mL/h via INTRAVENOUS

## 2017-07-11 MED ORDER — MEPERIDINE HCL 50 MG/ML IJ SOLN
INTRAMUSCULAR | Status: AC
Start: 2017-07-11 — End: 2017-07-11
  Administered 2017-07-11: 11:00:00 25 mg via INTRAVENOUS
  Filled 2017-07-11: qty 1

## 2017-07-11 MED ORDER — FERROUS SULFATE 324 (65 FE) MG PO TBEC
324.0000 mg | DELAYED_RELEASE_TABLET | Freq: Every day | ORAL | Status: DC
Start: 2017-07-11 — End: 2017-07-12
  Administered 2017-07-11: 17:00:00 324 mg via ORAL
  Filled 2017-07-11: qty 1

## 2017-07-11 MED ORDER — DEXAMETHASONE 4 MG PO TABS
2.0000 mg | ORAL_TABLET | Freq: Four times a day (QID) | ORAL | Status: DC
Start: 2017-07-11 — End: 2017-07-12
  Administered 2017-07-11 – 2017-07-12 (×3): 2 mg via ORAL
  Filled 2017-07-11 (×4): qty 1

## 2017-07-11 MED ORDER — NITROGLYCERIN 2 % TD OINT
TOPICAL_OINTMENT | TRANSDERMAL | Status: DC | PRN
Start: 2017-07-11 — End: 2017-07-11
  Administered 2017-07-11: 1 [in_us] via TOPICAL

## 2017-07-11 MED ORDER — ROPIVACAINE HCL 5 MG/ML IJ SOLN
INTRAMUSCULAR | Status: DC | PRN
Start: 2017-07-11 — End: 2017-07-11
  Administered 2017-07-11: 30 mL

## 2017-07-11 MED ORDER — PROMETHAZINE HCL 25 MG RE SUPP
25.0000 mg | Freq: Four times a day (QID) | RECTAL | Status: DC | PRN
Start: 2017-07-11 — End: 2017-07-12

## 2017-07-11 MED ORDER — LEVOTHYROXINE SODIUM 88 MCG PO TABS
88.00 ug | ORAL_TABLET | Freq: Every day | ORAL | Status: DC
Start: 2017-07-12 — End: 2017-07-12
  Administered 2017-07-12: 06:00:00 88 ug via ORAL
  Filled 2017-07-11: qty 1

## 2017-07-11 MED ORDER — BACITRACIN 50000 UNITS IM SOLR
INTRAMUSCULAR | Status: AC
Start: 2017-07-11 — End: ?
  Filled 2017-07-11: qty 100000

## 2017-07-11 MED ORDER — ONDANSETRON 4 MG PO TBDP
4.0000 mg | ORAL_TABLET | Freq: Three times a day (TID) | ORAL | Status: DC | PRN
Start: 2017-07-11 — End: 2017-07-12

## 2017-07-11 MED ORDER — PROPOFOL INFUSION 10 MG/ML
INTRAVENOUS | Status: DC | PRN
Start: 2017-07-11 — End: 2017-07-11
  Administered 2017-07-11: 30 ug/kg/min via INTRAVENOUS

## 2017-07-11 MED ORDER — EZETIMIBE 10 MG PO TABS
10.00 mg | ORAL_TABLET | Freq: Every evening | ORAL | Status: DC
Start: 2017-07-11 — End: 2017-07-12
  Administered 2017-07-11: 21:00:00 10 mg via ORAL
  Filled 2017-07-11: qty 1

## 2017-07-11 MED ORDER — CEFAZOLIN SODIUM 1 G IJ SOLR
2.0000 g | INTRAMUSCULAR | Status: AC
Start: 2017-07-11 — End: 2017-07-11
  Administered 2017-07-11: 09:00:00 2 g via INTRAVENOUS

## 2017-07-11 MED ORDER — VANCOMYCIN HCL POWD
Status: DC | PRN
Start: 2017-07-11 — End: 2017-07-11
  Administered 2017-07-11: 1000 mg

## 2017-07-11 MED ORDER — EPHEDRINE SULFATE 50 MG/ML IJ/IV SOLN (WRAP)
Status: AC
Start: 2017-07-11 — End: ?
  Filled 2017-07-11: qty 1

## 2017-07-11 MED ORDER — MAGNESIUM HYDROXIDE 400 MG/5ML PO SUSP
10.00 mL | Freq: Every day | ORAL | Status: DC | PRN
Start: 2017-07-11 — End: 2017-07-12

## 2017-07-11 MED ORDER — OXYCODONE HCL ER 10 MG PO T12A
10.0000 mg | EXTENDED_RELEASE_TABLET | ORAL | Status: AC
Start: 2017-07-11 — End: 2017-07-11
  Administered 2017-07-11: 08:00:00 10 mg via ORAL

## 2017-07-11 MED ORDER — TRAMADOL HCL 50 MG PO TABS
50.0000 mg | ORAL_TABLET | Freq: Four times a day (QID) | ORAL | Status: DC | PRN
Start: 2017-07-11 — End: 2017-07-12
  Administered 2017-07-11: 50 mg via ORAL
  Filled 2017-07-11: qty 1

## 2017-07-11 MED ORDER — TRANEXAMIC ACID 1000 MG/10ML IV SOLN
INTRAVENOUS | Status: AC
Start: 2017-07-11 — End: 2017-07-11
  Filled 2017-07-11: qty 10

## 2017-07-11 MED ORDER — PROMETHAZINE HCL 25 MG PO TABS
25.0000 mg | ORAL_TABLET | Freq: Four times a day (QID) | ORAL | Status: DC | PRN
Start: 2017-07-11 — End: 2017-07-12

## 2017-07-11 MED ORDER — PHENYLEPHRINE HCL 10 MG/ML IV SOLN (WRAP)
Status: AC
Start: 2017-07-11 — End: ?
  Filled 2017-07-11: qty 1

## 2017-07-11 MED ORDER — ONDANSETRON HCL 4 MG/2ML IJ SOLN
INTRAMUSCULAR | Status: DC | PRN
Start: 2017-07-11 — End: 2017-07-11
  Administered 2017-07-11: 4 mg via INTRAVENOUS

## 2017-07-11 MED ORDER — SENNOSIDES-DOCUSATE SODIUM 8.6-50 MG PO TABS
2.00 | ORAL_TABLET | Freq: Every evening | ORAL | Status: DC
Start: 2017-07-11 — End: 2017-07-12
  Administered 2017-07-11: 21:00:00 2 via ORAL
  Filled 2017-07-11: qty 2

## 2017-07-11 MED ORDER — ASPIRIN 81 MG PO TBEC
81.0000 mg | DELAYED_RELEASE_TABLET | Freq: Two times a day (BID) | ORAL | Status: DC
Start: 2017-07-11 — End: 2017-07-12
  Administered 2017-07-11: 21:00:00 81 mg via ORAL
  Filled 2017-07-11 (×2): qty 1

## 2017-07-11 MED ORDER — FLUOXETINE HCL 20 MG PO CAPS
20.00 mg | ORAL_CAPSULE | Freq: Every morning | ORAL | Status: DC
Start: 2017-07-11 — End: 2017-07-11

## 2017-07-11 MED ORDER — HYDROCODONE-ACETAMINOPHEN 10-325 MG PO TABS
1.0000 | ORAL_TABLET | ORAL | Status: DC | PRN
Start: 2017-07-11 — End: 2017-07-12
  Administered 2017-07-11 (×2): 1 via ORAL
  Filled 2017-07-11 (×3): qty 1

## 2017-07-11 MED ORDER — MIDAZOLAM HCL 5 MG/5ML IJ SOLN
INTRAMUSCULAR | Status: AC
Start: 2017-07-11 — End: ?
  Filled 2017-07-11: qty 5

## 2017-07-11 MED ORDER — FLUOXETINE HCL 10 MG PO CAPS
10.00 mg | ORAL_CAPSULE | Freq: Every day | ORAL | Status: DC
Start: 2017-07-11 — End: 2017-07-12
  Administered 2017-07-11 – 2017-07-12 (×2): 10 mg via ORAL
  Filled 2017-07-11 (×2): qty 1

## 2017-07-11 MED ORDER — VITAMIN C 500 MG PO TABS
500.0000 mg | ORAL_TABLET | Freq: Every day | ORAL | Status: DC
Start: 2017-07-11 — End: 2017-07-12
  Administered 2017-07-11: 17:00:00 500 mg via ORAL
  Filled 2017-07-11: qty 1

## 2017-07-11 MED ORDER — LABETALOL HCL 5 MG/ML IV SOLN (WRAP)
INTRAVENOUS | Status: DC | PRN
Start: 2017-07-11 — End: 2017-07-11
  Administered 2017-07-11 (×5): 2.5 mg via INTRAVENOUS

## 2017-07-11 MED ORDER — GLYCOPYRROLATE 0.2 MG/ML IJ SOLN
INTRAMUSCULAR | Status: AC
Start: 2017-07-11 — End: ?
  Filled 2017-07-11: qty 1

## 2017-07-11 MED ORDER — GLYCOPYRROLATE 0.2 MG/ML IJ SOLN
INTRAMUSCULAR | Status: DC | PRN
Start: 2017-07-11 — End: 2017-07-11
  Administered 2017-07-11: 0.2 mg via INTRAVENOUS

## 2017-07-11 MED ORDER — FAMOTIDINE 20 MG/2ML IV SOLN
INTRAVENOUS | Status: AC
Start: 2017-07-11 — End: ?
  Filled 2017-07-11: qty 2

## 2017-07-11 MED ORDER — BUPIVACAINE HCL (PF) 0.5 % IJ SOLN
INTRAMUSCULAR | Status: AC
Start: 2017-07-11 — End: ?
  Filled 2017-07-11: qty 30

## 2017-07-11 MED ORDER — NALOXONE HCL 0.4 MG/ML IJ SOLN (WRAP)
0.4000 mg | INTRAMUSCULAR | Status: DC | PRN
Start: 2017-07-11 — End: 2017-07-12

## 2017-07-11 MED ORDER — FLUOXETINE HCL 20 MG PO CAPS
20.00 mg | ORAL_CAPSULE | Freq: Every day | ORAL | Status: DC
Start: 2017-07-11 — End: 2017-07-12
  Administered 2017-07-11 – 2017-07-12 (×2): 20 mg via ORAL
  Filled 2017-07-11 (×2): qty 1

## 2017-07-11 SURGICAL SUPPLY — 68 items
ADAPTER BIOLOX TPR -6 NECK (Adapter) ×1 IMPLANT
ADHESIVE SKIN CLOSURE DERMABOND ADVANCED (Skin Closure) ×1
ADHESIVE SKIN CLOSURE DERMABOND ADVANCED .7 ML LIQUID APPLICATOR (Skin Closure) ×1 IMPLANT
ADHESIVE SKIN DERMABOND ADV (Skin Closure) ×1
APPLCATOR CHLORAPREP 26ML (Prep) ×4 IMPLANT
BIT DRILL L20 MM OD3.2 MM (Drillbits) ×1
BIT DRILL L20 MM OD3.2 MM RINGLOC+ (Drillbits) ×1 IMPLANT
BIT DRILL L30 MM OD3.2 MM (Drillbits) ×1
BIT DRILL L30 MM OD3.2 MM RINGLOC+ (Drillbits) ×1 IMPLANT
BLADE SAGITTAL 18X1.27X90MM (Blade) ×2 IMPLANT
COVER CAM LF STRL LEN DISP CLR (Procedure Accessories) ×2
COVER CAMERA DISP STERILE (Procedure Accessories) ×2
COVER CAMERA LENS DISPOSABLE CLEAR STERILE LATEX FREE (Procedure Accessories) ×2 IMPLANT
COVER HIP GRIP (Procedure Accessories) ×2 IMPLANT
DRILL BIT RINGLOC 3.2X30MM (Drillbits) ×1
DRILL BIT RNGLC ACT 3.2 X 20MM (Drillbits) ×1
GLOVE BIOGEL SURG PF SZ 8.5 (Glove) ×8 IMPLANT
GOWN SMARTGOWN XXL XLONG (Gown) ×1
GOWN SRG 2XL XLNG SMARTGOWN LF STRL LVL (Gown) ×1
GOWN SURGICAL 2XL XLONG SMARTGOWN LEVEL 4 RAGLAN SLEEVE BREATHABLE (Gown) ×1 IMPLANT
HEAD BIOLOX DELTA OPT CER 36MM (Head) ×1 IMPLANT
HEAD FEMORAL OD36 MM HIP BIOLOX DELTA (Head) ×1 IMPLANT
HEAD FEMORAL OD36 MM HIP BIOLOX DELTA OPTION G7 (Head) ×1 IMPLANT
HOOD FLYTE PEELAWAY (Procedure Accessories) ×5
HOOD SURGEON ZIPPER PEEL AWAY LENS FLYTE (Procedure Accessories) ×5 IMPLANT
JACKET UNIVERSAL SMS VEST SNAP CLOSURE (Gown) ×5
JACKET UNIVERSAL SMS VEST SNAP CLOSURE BLUE ULTRAGARD STAFF (Gown) ×5 IMPLANT
LINER ACETABULAR ID36 MM E NEUTRAL E1 G7 (Liner) ×1 IMPLANT
LINER ACETABULAR ID36 MM E NEUTRAL E1 G7 HIP (Liner) ×1 IMPLANT
LINER G7 NEUT E1 36MM SZE (Liner) ×1 IMPLANT
MAT OR L46 IN X W40 IN MEDIUM ABSORBENT (Procedure Accessories) ×1
MAT OR L46 IN X W40IN MD ABSRBNT FLUID BARRIER BACK SURGISAFE BLUE (Procedure Accessories) ×1 IMPLANT
PAD FLOOR SURGI SAFE 46X40IN (Procedure Accessories) ×1
POUCH PVP LIQUID STRL 0.75OZ (Prep) ×1
SCREW G7 LOPRO DOME 6.5X20MM (Screw) ×1 IMPLANT
SCREW G7 LOPRO DOME 6.5X30MM (Screw) ×1 IMPLANT
SCREW L20 MM OD6.5 MM DOME HIP (Screw) ×1 IMPLANT
SCREW L20 MM OD6.5 MM DOME HIP ACETABULAR LOW PROFILE BONE G7 (Screw) ×1 IMPLANT
SCREW L30 MM OD6.5 MM DOME HIP (Screw) ×1 IMPLANT
SCREW L30 MM OD6.5 MM DOME HIP ACETABULAR LOW PROFILE BONE G7 (Screw) ×1 IMPLANT
SHELL ACETABULAR OD52 MM HIP LIMIT HOLE (Shell) ×1 IMPLANT
SHELL ACETABULAR OD52 MM HIP LIMIT HOLE OSSEOTI G7 E (Shell) ×1 IMPLANT
SHELL G7 OSSEOTI 3H SZE 52MM (Shell) ×1 IMPLANT
SLEEVE CENTERING HIP -6 MM OFFSET TAPER (Adapter) ×1 IMPLANT
SLEEVE CENTERING HIP -6 MM OFFSET TAPER G7 TITANIUM TYPE 1 BIOLOX (Adapter) ×1 IMPLANT
SLEEVE SEQUEN COMP KNEE REG (Procedure Accessories) ×2 IMPLANT
SLN ALC ISOPROPYL 70 4OZ (Scrub Supplies) ×1
SOL NACL 0.9% IRR 3000CC (Irrigation Solutions) ×1
SOL NACL.9% 1000ML IRR NONLTX (Irrigation Solutions) ×2
SOL WATER STERILE 1000CC BTLE (Irrigation Solutions) ×2
SOLUTION ANTISEPTIC RUB BOTTLE MEDLINE (Scrub Supplies) ×1 IMPLANT
SOLUTION IRR 0.9% NACL 3L URMTC LF PLS (Irrigation Solutions) ×1
SOLUTION IRRIGATION 0.9% SODIUM CHLORIDE (Irrigation Solutions) ×2
SOLUTION IRRIGATION 0.9% SODIUM CHLORIDE 1000 ML PLASTIC POUR BOTTLE (Irrigation Solutions) ×2 IMPLANT
SOLUTION IRRIGATION 0.9% SODIUM CHLORIDE 3000 ML PLASTIC CONTAINER (Irrigation Solutions) ×1 IMPLANT
SOLUTION PREP POUCH PACKET CONTAINER (Prep) ×1
SOLUTION PREP POUCH PACKET CONTAINER PVP IODINE 3/4 OZ (Prep) ×1 IMPLANT
SPONGE GAUZE L4 IN X W4 IN 12 PLY (Sponge) ×1 IMPLANT
SPONGE GAUZE STR 10'S 12PLY4X4 (Sponge) ×1
STEM FEMORAL L105 MM OD10 MM 133 D (Stem) ×1 IMPLANT
STEM FEMORAL L105 MM OD10 MM 133 D STANDARD OFFSET TAPER PRESS FIT (Stem) ×1 IMPLANT
STEM TAPERLOC STD MICRO SZ10 (Stem) ×1 IMPLANT
SUTURE MONOCRYL 2-0 CT1 36IN (Suture) ×10 IMPLANT
TRAY ANTERIOR HIP SUPINE (Tray) ×2 IMPLANT
UNDRGLOV SZ8.5 PI INDICATR BLU (Glove) ×2 IMPLANT
VEST SURGICAL FRONT OPEN (Gown) ×5
WATER STERILE PLASTIC POUR BOTTLE 1000 (Irrigation Solutions) ×2
WATER STERILE PLASTIC POUR BOTTLE 1000 ML (Irrigation Solutions) ×2 IMPLANT

## 2017-07-11 NOTE — Interval H&P Note (Signed)
The history and physical exam completed by internal medicine consultant for this patient has been reviewed. The patient has been re-examined within the past 24 hours and there are no apparent interval changes to the patient's status. The patient appears ready to proceed with surgery. The patient has been given the opportunity to ask any additional questions regarding the proposed procedure.  The patient appears ready to proceed as planned. All questions were answered.

## 2017-07-11 NOTE — Anesthesia Preprocedure Evaluation (Addendum)
Anesthesia Evaluation    AIRWAY    Mallampati: II    TM distance: >3 FB  Neck ROM: full  Mouth Opening:full  Planned to use difficult airway equipment: No CARDIOVASCULAR    cardiovascular exam normal       DENTAL    no notable dental hx     PULMONARY    pulmonary exam normal     OTHER FINDINGS    History of difficult intubation, s/p jaw surgery 1981  Patient states that her last surgery (hysterectomy) 6 years ago, was uneventful, she is not aware that an awake fiberoptic intubation was done.            Scheduled  Procedure(s):  ARTHROPLASTY, HIP TOTAL  Attending Physician: Lane Hacker, MD  Admission Date:07/11/2017      Assessment:      Patient Active Problem List   Diagnosis   . Thyroid disease   . Depression   . Hyperlipidemia   . Health care maintenance   . Osteoporosis   . History of colonoscopy   . SDH (subdural hematoma)   . Subarachnoid hemorrhage   . SAH (subarachnoid hemorrhage)   . Dysphagia          Past Medical History:   Diagnosis Date   . Anxiety    . Arthritis    . Asthma    . Bilateral cataracts     early stages   . Depression     on prozac many yrs stable sees therapist   . Difficulty walking    . Diverticulitis    . Dysphagia    . Dyspnea on exertion    . Ear, nose and throat disorder     seasonal allergies   . Eczema    . Gastroesophageal reflux disease    . Hard to intubate    . Health care maintenance     colonscopy 1/08-5 yr f/u dr Durwin Nora  pap-1/12  mammo-, dexa-1/12,mammo 3/13  tetanius shot 3/12, with MMR and hep A/B  had pneumovax age 64  NL CXR 05/30/11   . History of colonoscopy 2007    MGM colon cancer 70  nl colonscopy 2007 dr Durwin Nora   . Hyperlipidemia     sees cards dr. Neomia Dear  9/12 chol 274 trigs 225 H53/L176 had myositis with statins,niacin-now on zetia had nl carotid duplex/thallium 1/12 Nl thallium-6/13  11/13 chol 260 H63/L173,ck= 305,cmp-nl creat 0.95-Referred to rheum 5/`14 re elevated cpk Saw Dr Neomia Dear 9/14-myalgia gone ,off tricor,but ck still even higher-echo/thallium ordred   .  Hypertension    . Hypothyroidism    . Osteoporosis     o'penia had been on reclast per gyn and ergocalciferol early 2012. dexa 1/12-T scores -1.1, -0.5   . Restless legs syndrome    . Routine history and physical examination of adult 02/2010    last Tetanus shot 2012   . SAH (subarachnoid hemorrhage)    . Syncope and collapse    . Thyroid disease     hypothyroid on synthroid   . Urinary tract infection             Past Surgical History:   Procedure Laterality Date   . COLONOSCOPY     . EGD N/A 08/28/2016    Procedure: EGD;  Surgeon: Lestine Mount, MD;  Location: [facility];  Service: Gastroenterology;  Laterality: N/A;   . EYE SURGERY  2011   . HYSTERECTOMY      complete laparoscopic hyterectomy,rectocele/cystocele repair 09/12/11 in [place]   .  maxillofacial  1981   . NASAL SEPTUM SURGERY         Social History     Social History   Substance Use Topics   . Smoking status: Never Smoker   . Smokeless tobacco: Never Used   . Alcohol use Yes      Comment: glass of wine once/month      Allergies:     Allergies   Allergen Reactions   . Fenofibrate Swelling   . Niacin Swelling   . Onion Other (See Comments)     Gi distress   . Peppers Other (See Comments)     GI distress   . Statins      Muscle pain and liver enzymes elevated   . Sulfa Antibiotics Hives   . Sulfa Drugs Cross Reactors Hives   . Colesevelam Rash   . Erythromycin Rash   . Neosporin [Neomycin-Bacitracin Zn-Polymyx] Rash   . Penicillins Rash     Pt states she is not allergic to Penicillins.     Hospital Medications:     Current Facility-Administered Medications   Medication Dose Route Frequency   . ceFAZolin  2 g Intravenous Pre-Op   . scopolamine  1 patch Transdermal Once   . tranexamic acid  1,000 mg Intravenous Once     Home Medications:     Prescriptions Prior to Admission   Medication Sig Dispense Refill Last Dose   . acetaminophen (TYLENOL) 650 MG CR tablet Take 650 mg by mouth every 8 (eight) hours as needed for Pain.   07/10/2017 at Unknown time   .  Cholecalciferol (VITAMIN D) 1000 UNITS capsule Take 1,000 Units by mouth every morning       07/01/2017   . co-enzyme Q-10 30 MG capsule Take 30 mg by mouth daily as needed       07/01/2017   . desoximetasone (TOPICORT) 0.05 % cream Apply topically as needed       Past Week at Unknown time   . Evolocumab (REPATHA SURECLICK) 140 MG/ML Solution Auto-injector 1 mL Every 2 weeks   07/01/2017   . ezetimibe (ZETIA) 10 MG tablet Take 10 mg by mouth every evening       07/10/2017 at Unknown time   . FLUoxetine (PROZAC) 10 MG tablet Take 10 mg by mouth every morning       07/10/2017 at Unknown time   . FLUoxetine (PROZAC) 20 MG capsule Take 20 mg by mouth every morning   07/10/2017 at Unknown time   . fluticasone (FLONASE) 50 MCG/ACT nasal spray INSTILL SPRAY IN NOSTRIL TWICE A DAY  3 07/11/2017 at 0500   . glucosamine-chondroitin 500-400 MG tablet Take 1 tablet by mouth as needed       07/01/2017   . levothyroxine (SYNTHROID, LEVOTHROID) 88 MCG tablet Take 88 mcg by mouth every morning       07/10/2017 at Unknown time   . Multiple Vitamins-Minerals (MULTIVITAMIN WITH MINERALS) tablet Take 1 tablet by mouth every morning       07/01/2017   . Omega-3 Fatty Acids (OMEGA-3 FISH OIL) 500 MG Cap Take by mouth daily as needed       07/01/2017   . oxyCODONE-acetaminophen (PERCOCET) 5-325 MG per tablet Take 1 tablet by mouth every 6 (six) hours as needed       Past Week at Unknown time   . pantoprazole (PROTONIX) 40 MG tablet Take 1 tablet (40 mg total) by mouth every morning before breakfast. 30 tablet 0  07/10/2017 at Unknown time   . pramipexole (MIRAPEX) 0.25 MG tablet Take 0.5 mg by mouth nightly       07/10/2017 at Unknown time   . TURMERIC PO Take by mouth every morning       07/01/2017   . VITAMIN E PO Take by mouth every morning       07/01/2017   . White Petrolatum-Mineral Oil (SYSTANE NIGHTTIME) Ointment every evening  99 Past Week at Unknown time   . gabapentin (NEURONTIN) 300 MG capsule TAKE 1 TABLET AT NIGHT  1 More than a month at  Unknown time           Vitals:     Vitals:    07/11/17 0830   BP:    Pulse: 73   Resp: 16   Temp:    SpO2: 97%       Labs:     Results     Procedure Component Value Units Date/Time    Type and Screen [[id number]] Collected:  07/11/17 0734    Specimen:  Blood Updated:  07/11/17 0745    Narrative:       Place BRID    RBC O.R. HOLD Blood Product, 1 Units [[id number]] Collected:  07/11/17 0734     Updated:  07/11/17 0745               Lab Results   Component Value Date    HGB 13.7 05/24/2017    PLT 285 05/24/2017       Lab Results   Component Value Date    NA 141 05/24/2017    CL 109 05/24/2017    CO2 24 05/24/2017    K 4.0 05/24/2017    CREAT 0.8 05/24/2017    BUN 14 05/24/2017    GLU 87 05/24/2017       Lab Results   Component Value Date    AST 20 05/24/2017    ALT 23 05/24/2017    TROPI 0.01 08/26/2016       Lab Results   Component Value Date    PT 13.0 05/24/2017    PTT 29 05/24/2017    INR 1.0 05/24/2017       Rads:     Radiology Results (24 Hour)     ** No results found for the last 24 hours. **          No LMP recorded. Patient has had a hysterectomy.      Body mass index is 26.19 kg/m.    Stop-Bang Score: 2            Relevant Problems   No relevant active problems               Anesthesia Plan    ASA 2     CSE                     Detailed anesthesia plan: CSE        Post op pain management: per surgeon    informed consent obtained    Plan discussed with CRNA.    ECG reviewed  pertinent labs reviewed             Signed by: Camillo Flaming 07/11/17 8:32 AM

## 2017-07-11 NOTE — Op Note (Signed)
FULL OPERATIVE NOTE    Date Time: 07/11/17 10:52 AM  Patient Name: Tracy [name],Tracy [name]  Attending Physician: Lane Hacker, MD      Date of Operation:   07/11/2017    Providers Performing:   Surgeon(s):  Lane Hacker, MD  Ocie Bob, PA  Kyliyah Stirn, Tollie Pizza, MD    Assistant (s):   Circulator: Annabelle Harman, RN  Relief Circulator: Markham Jordan., RN  Scrub Person: Carleene Cooper, RN  Preceptor: Frederick Peers, RN  Second Scrub Person: Verne Carrow    Preoperative Diagnosis:   Severe left hip degenerative joint disease    Postoperative Diagnosis:   Severe left hip degenerative joint disease    Operative Procedure:   Left Total Hip Arthroplasty    Anesthesia:   Spinal, epidural    Estimated Blood Loss:   200cc    Specimens:   None.    Complications:   None, stable to PACU.    Implants Utilized:     Implant Name Type Inv. Item Serial No. Manufacturer Lot No. LRB No. Used Action   SHELL G7 OSSEOTI 3H SZE - [id number] Shell SHELL G7 OSSEOTI 3H SZE  [employer] [id number] Left 1 Implanted   LINER G7 NEUT E1 SZE - [id number] Liner LINER G7 NEUT E1 SZE  [employer] [id number] Left 1 Implanted   SCREW G7 LOPRO DOME 6.5X30MM - [id number] Screw SCREW G7 LOPRO DOME 6.5X30MM  [employer]ET [id number] Left 1 Implanted   SCREW G7 LOPRO DOME 6.5X20MM - [id number] Screw SCREW G7 LOPRO DOME 6.5X20MM  [employer]ET [id number] Left 1 Implanted   HEAD BIOLOX DELTA OPT CER - [id number] Head HEAD BIOLOX DELTA OPT CER  [employer] [id number] Left 1 Implanted   STEM TAPERLOC STD MICRO SZ10 - [id number] Stem STEM TAPERLOC STD MICRO SZ10  [employer] [id number] Left 1 Implanted   ADAPTER BIOLOX TPR -6 NECK - [id number] Adapter ADAPTER BIOLOX TPR -6 NECK   [employer]ET [id number] Left 1 Implanted         Indications:   This is a 68 y.o.-year-old patient with a long history of severe left hip pain secondary to degenerative joint disease.  After review of the appropriate imaging studies, a detailed physical examination and history, it was recommended  that the patient undergo left total hip arthroplasty as the patient had failed conservative management.      Operative Notes:   Prior to initiation of the operative procedure a surgical time-out was taken and the patient's name, medical record, number, date-of-birth, and operative side was verified with the surgical consent form.  Additionally it was verified that the appropriate peri-operative antibiotic administration was completed, as well as that radiographs were available and the correct operative instrumentation and implants were available.    After routine preparation and draping of the patient in the total joint operating room, a direct anterior approach was made to the left hip joint.  The skin and subcutaneous tissues were incised.  Electrocautery was utilized to maintain hemostasis along the skin edges.  The subcutaneous fat was bluntly dissected.  Subsequently the tensor fascia lata muscle was visualized and the medial and lateral borders identified.  The medial border was identified by the fat strip that lies just medial to the muscle.     The tip of the knife blade was used to gently incise the TFL fascia at the junction of the medial third and middle third of the TFL muscle belly.  Care was taken to ensure  that the musculature was not damaged at the area of the fascial incision.  Finger dissection was used to locate the anterior femoral neck.  A blunt homan retractor was then placed in the split at the level of the piriformis fossa.  Subsequently the plane between the TFL and fascia was expanded distally to the inferior aspect of the wound.  A sharp homan retractor was placed at the level of the vastus ridge retracting the mid aspect of the TFL.  The lateral circumflex vessels were then visualized and cauterized.  The interval between the rectus and the anterior hip capsule at the level of the inferior neck was completed and a blunt homan retractor placed.  Subsequently the interval between the  anterior capsule and the rectus was developed with electrocautery and completed with careful blunt dissection with a cobb elevator.      The anterior capsule was split in a "T" formation from the superolateral neck to the inferomedial neck and a capsulectomy completed.  The saddle of the proximal femur was identified.  The electrocautery was used to mark the sites of the femoral neck cut.  A double osteotomy was completed to remove a wafer of bone, following which a power corkscrew was inserting into the remaining femoral head to extract it from the acetabulum.  Then, a blunt homan was placed into the teardrop and the sharp homan placed under the posterior acetabular wall.  The acetabular labrum was excised with a kocher and electrocautery.  An osteotome was utilized to remove the inferior and medial osteophytes.       The acetabulum was subsequently reamed and shaped to accept a size 52mm acetabular shell.  The shell was impacted into place.  Multiple cancellous bone screws were utilized to augment the fixation into the superior dome.  A 36mm neutral liner was inserted into the shell.      The medial and posterior retractors were then removed and the leg brought into adduction, external rotation.  The pointed homan retractor was placed again at the vastus ridge.  The superolateral capsule was identified and then resected.  Subsequently a double-footed Mueller retractor was placed under the greater trochanter to allow for elevation of the proximal femur.  The posterormedial capsule was then incised and the proximal femur lifted with a bone hook.  The anterior retractor was then removed and a double-footed Mueller retractor placed at the level of the calcar.  The femoral neck cut was clearly identified.  A small curette was used as a canal finder.  A box osteotome was utilized to remove remaining lateral femoral neck.  The femur was then broached in a sequential manner. The femoral canal was shaped to accept a size  #10 SO hip stem.  Trial head and neck components were placed and stability, leg length, and range of motion assessed.  The final stem was impacted into place using a 36mm ceramic femoral head with a 0mm neck length.  Trial and final reduction of the hip revealed good leg length, stability, and range of motion.    The wound was closed in layers using vicryl suture, monocryl suture, and monocryl subcuticular suture and a sterile dressing was applied.      The first-assistant was critical to all steps of the operation, including retraction and leg stabilization during exposure and bone preparation, as well as the deep and superficial wound closure.  Dr. Ahmed Prima performed and/or supervised the key portions of this surgical procedure, including evaluation of the bone cuts, reshaping  of the bony elements, and insertion of the provisional and final components.    Signed by: Rosey Bath, MD

## 2017-07-11 NOTE — Transfer of Care (Signed)
Anesthesia Transfer of Care Note    Patient: Tracy [name]    Procedures performed: Procedure(s):  ARTHROPLASTY, HIP TOTAL    Anesthesia type: Combined Spinal/Epidural    Patient location:Phase I PACU    Last vitals:   Vitals:    07/11/17 1009   BP: 121/59   Pulse: 76   Resp: 15   Temp: 97.7   SpO2: 99%       Post pain: Patient not complaining of pain, continue current therapy      Mental Status:awake and alert     Respiratory Function: tolerating nasal cannula    Cardiovascular: stable    Nausea/Vomiting: patient not complaining of nausea or vomiting    Hydration Status: adequate    Post assessment: no apparent anesthetic complications, no reportable events and no evidence of recall    Signed by: Kerby Moors  07/11/17 10:08 AM

## 2017-07-11 NOTE — Progress Notes (Signed)
Patient admitted to unit from PACU. S/P left THA. Oriented to room. Dressing to site is telfa and transparent film and is DRESSINGS: Dry and Intact Neuro vascular assessment in flowsheets. Call bell within reach. Overbed table near the bed. Fall Safety Plan discussed with the patient. Admitting notes completed. The patient and family participate and agree to the Care Plan and Goals. 20G R AC is infusing

## 2017-07-11 NOTE — UM Notes (Signed)
SI: 67 YO FEMALE WITH WORSENING HIP PAIN ADMITTED FOR LEFT THA  PMHXOF    Anxiety    . Arthritis    . Asthma    . Bilateral cataracts     early stages   . Depression     on prozac many yrs stable sees therapist   . Difficulty walking    . Diverticulitis    . Dysphagia    . Dyspnea on exertion    . Ear, nose and throat disorder     seasonal allergies   . Eczema    . Gastroesophageal reflux disease    . Hard to intubate    . Health care maintenance     colonscopy 1/08-5 yr f/u dr Durwin Nora  pap-1/12  mammo-, dexa-1/12,mammo 3/13  tetanius shot 3/12, with MMR and hep A/B  had pneumovax age 79  NL CXR 05/30/11   . History of colonoscopy 2007    MGM colon cancer 70  nl colonscopy 2007 dr Durwin Nora   . Hyperlipidemia     sees cards dr. Neomia Dear  9/12 chol 274 trigs 225 H53/L176 had myositis with statins,niacin-now on zetia had nl carotid duplex/thallium 1/12 Nl thallium-6/13  11/13 chol 260 H63/L173,ck= 305,cmp-nl creat 0.95-Referred to rheum 5/`14 re elevated cpk Saw Dr Neomia Dear 9/14-myalgia gone ,off tricor,but ck still even higher-echo/thallium ordred   . Hypertension    . Hypothyroidism    . Osteoporosis     o'penia had been on reclast per gyn and ergocalciferol early 2012. dexa 1/12-T scores -1.1, -0.5   . Restless legs syndrome    . Routine history and physical examination of adult 02/2010    last Tetanus shot 2012   . SAH (subarachnoid hemorrhage)    . Syncope and collapse    . Thyroid disease     hypothyroid on synthroid   . Urinary tract infection         IS: LEFT THA, ANTERIOR APPROACH COMPLETE, LR @ 100, IV VANCOMYCIN, PT EVAL AND TREATMENT, WBAT,NO EXTREMES OF MOTION    Tristan Schroeder RN,   Total Joint Case Manager   [place]  [phone]  [phone] fax

## 2017-07-11 NOTE — PT Progress Note (Signed)
Physical Therapy Treatment    Patient:Tracy [name]   UEA#:[id number]   Unit:[facility]  Bed:  M456/M456.01    PT Received On: 07/11/17 Start Time: 1515 Stop Time: 1615  Time Calculation (min): 60 min PT Visit Number: 1/7    Medical Diagnosis:  Primary localized osteoarthritis of left hip [M16.12]  Precautions  Weight Bearing Status: LLE WBAT  Total Hip Replacement: no extremes in ROM (Left Hip Replacement by Dr. Ahmed Prima on 06/06/17)  Precaution Instructions Given to Patient: Yes      Patient's medical condition is appropriate for Physical Therapy  intervention at this time.  Patient cleared by nurse to participate in PT session, nursing reports pt not due for pain meds for another hour.    ASSESSMENT/PLAN   Pt reports increased levels of pain but demonstrates good willingness to work with PT despite elevated pain levels. Pt requiring A for L LE management and moves extremely slowly with need for rests frequently.  RN/clin tech notified of this sessions outcome, including walked into hall.  Continue plan of care.    Goals per Eval/Re-eval:   Goals  Goal Formulation: With patient/family  Time for Goal Acheivement: 7 visits  Goals: Select goal  Pt Will Go Supine To Sit: with supervision  Pt Will Perform Sit To Supine: with supervision  Pt Will Perform Sit to Stand: with supervision  Pt Will Transfer to Toilet: with stand by assist  Pt Will Transfer to Tub/Shower: with stand by assist  PT Will Demonstrate Car Transfer Technique: with minimal assist  Pt Will Ambulate: 151-200 feet, with supervision  Pt Will Go Up / Down Stairs: 6-10 stairs, with stand by assist  Pt Will Perform Home Exer Program: with stand by assist         Plan  Risks/Benefits/POC Discussed with Pt/Family: With patient/family  Patient Goal:  (walk dog, dance, exercises, drive motorcycle)  Treatment/Interventions: Exercise;Gait training;Stair training;Neuromuscular re-education;Functional transfer training;LE  strengthening/ROM;Endurance training;Patient/family training;Equipment eval/education;Bed mobility  PT Frequency: twice a day     Additional patient/coach education provided per Education Tab.    Recommendation  Discharge Recommendation: Home with supervision, Home with home health PT  PT - Next Visit Recommendation: 07/12/17  PT Frequency: twice a day    SUBJECTIVE   Pt stating she feels better than earlier today. Patient is agreeable to participation in the therapy session. Family and/or guardian are agreeable to patient's participation in the therapy session. Nursing clears patient for therapy.  Patient Goal:  (walk dog, dance, exercises, drive motorcycle)  Pain Assessment  Pain Assessment: Numeric Scale (0-10)  Pain Score: 7-severe pain  POSS Score: Awake and Alert  Pain Location: Hip  Pain Orientation: Left  Pain Descriptors: Aching;Sharp  Pain Frequency: Continuous;Increases with movement  Effect of Pain on Daily Activities: mild  Patient's Stated Comfort Functional Goal: 4-moderate pain  Pain Intervention(s): Cold applied;Repositioned          OBJECTIVE   Patient is in bed  seen Day of Surgery with IV, dressings and SCDs.    Cognition/Neuro Status  Arousal/Alertness: Appropriate responses to stimuli  Attention Span: Appears intact  Memory: Appears intact  Following Commands: Follows one step commands without difficulty  Safety Awareness: minimal verbal instruction  Insights: Fully aware of deficits  Problem Solving: minimal assistance    Sensation is intact , to light touch    Gross ROM  Right Lower Extremity ROM:  [facility] DF/PF AROM)  Left Lower Extremity ROM:  [facility]  DF/PF AROM)                                  Functional Mobility  Supine to Sit: Minimal Assist;Increased Time;Increased Effort (A for L LE)  Scooting to HOB: Contact Guard Assist;Increased Time;Increased Effort  Scooting to EOB: Stand by Assist;Increased Time;Increased Effort  Sit to Supine: Minimal Assist (A for L LE management)  Sit to Stand:  Contact Guard Assist;Increased Time;Increased Effort;with instruction for hand placement to increase safety  Stand to Sit: Stand by Assist           Locomotion  Ambulation: Contact Guard Assist;with front-wheeled walker  Pattern: L decreased stance time;decreased cadence;decreased step length;Step to;Step through;Narrow BOS (flexed posture, heavy UE use)  PMP - Progressive Mobility Protocol   PMP Activity: Step 7 - Walks out of Room  Distance Walked (ft) (Step 6,7): 60 Feet (limited by pt fatigue)                   Self Care and Home Management:  Pt did not perform self care activities at this time.     Patient is in bed with call bell in reach and all needs provided.   Signature: Leanord Hawking, PT 07/11/2017 4:28 PM

## 2017-07-11 NOTE — PT Eval Note (Signed)
[facility]  [address]  [place] [address]  [phone]    Physical Therapy Evaluation    Patient: Tracy [name] MRN: [id number]   Unit:  [facility] Bed: M456/M456.01    Time of Treatment: Time Calculation  PT Received On: 07/11/17  Start Time: 1325  Stop Time: 1355  Time Calculation (min): 30 min    Consult received for Tracy [name] for PT evaluation and treatment.  Patient's medical condition is appropriate for Physical Therapy  intervention at this time.    History of Present Illness: Tracy [name] is a 67 y.o. female admitted on 07/11/2017   Medical Diagnosis: Primary localized osteoarthritis of left hip [M16.12]  Precautions  Weight Bearing Status: LLE WBAT  Total Hip Replacement: no extremes in ROM (Left Hip Replacement by Dr. Ahmed Prima on 06/06/17)  Precaution Instructions Given to Patient: Yes  MD orders for Exercises: active and passive    Patient Active Problem List   Diagnosis   . Thyroid disease   . Depression   . Hyperlipidemia   . Health care maintenance   . Osteoporosis   . History of colonoscopy   . SDH (subdural hematoma)   . Subarachnoid hemorrhage   . SAH (subarachnoid hemorrhage)   . Dysphagia   . Hip arthritis     Past Medical History:   Diagnosis Date   . Anxiety    . Arthritis    . Asthma    . Bilateral cataracts     early stages   . Depression     on prozac many yrs stable sees therapist   . Difficulty walking    . Diverticulitis    . Dysphagia    . Dyspnea on exertion    . Ear, nose and throat disorder     seasonal allergies   . Eczema    . Gastroesophageal reflux disease    . Hard to intubate    . Health care maintenance     colonscopy 1/08-5 yr f/u dr Durwin Nora  pap-1/12  mammo-, dexa-1/12,mammo 3/13  tetanius shot 3/12, with MMR and hep A/B  had pneumovax age 33  NL CXR 05/30/11   . History of colonoscopy 2007    MGM colon cancer 70  nl colonscopy 2007 dr Durwin Nora   . Hyperlipidemia     sees cards dr. Neomia Dear  9/12 chol 274  trigs 225 H53/L176 had myositis with statins,niacin-now on zetia had nl carotid duplex/thallium 1/12 Nl thallium-6/13  11/13 chol 260 H63/L173,ck= 305,cmp-nl creat 0.95-Referred to rheum 5/`14 re elevated cpk Saw Dr Neomia Dear 9/14-myalgia gone ,off tricor,but ck still even higher-echo/thallium ordred   . Hypertension    . Hypothyroidism    . Osteoporosis     o'penia had been on reclast per gyn and ergocalciferol early 2012. dexa 1/12-T scores -1.1, -0.5   . Restless legs syndrome    . Routine history and physical examination of adult 02/2010    last Tetanus shot 2012   . SAH (subarachnoid hemorrhage)    . Syncope and collapse    . Thyroid disease     hypothyroid on synthroid   . Urinary tract infection      Past Surgical History:   Procedure Laterality Date   . COLONOSCOPY     . EGD N/A 08/28/2016    Procedure: EGD;  Surgeon: Lestine Mount, MD;  Location: [facility];  Service: Gastroenterology;  Laterality: N/A;   . EYE SURGERY  2011   .  HYSTERECTOMY      complete laparoscopic hyterectomy,rectocele/cystocele repair 09/12/11 in [place]   . maxillofacial  1981   . NASAL SEPTUM SURGERY         Social History:  Prior Level of Function  Prior level of function: Independent with ADLs;Ambulates with assistive device (sittin down)  Assistive Device: Single point cane  Baseline Activity Level: Community ambulation  Driving: independent  Cooking: microwave only  Employment: FT  DME Currently at Home: Single point cane;Front wheel walker (step stool w/ handle)    Home Living Arrangements  Living Arrangements: Alone (sisiter to stay post op x2wks)  Type of Home: House  Home Layout: Two level;Able to live on main level with bedroom/bathroom (4+4+1 STE w/ railing:)  Bathroom Shower/Tub: Tub/shower unit  Bathroom Toilet: Standard  Bathroom Accessibility: Accessible via walker  DME Currently at Home: Single point cane;Front wheel walker (step stool w/ handle)    ASSESSMENT/PLAN   Tracy [name] is a 67 y.o. female admitted  07/11/2017.  Pt's functional mobility is impacted by:  decreased activity tolerance, decreased bed mobility, abnormal blood pressure, motor control, pain, ROM deficits and decreased strength.  There are a few comorbidities or other factors that affect plan of care and require modification of task including: assistive device needed for mobility, recent surgery and has stairs to manage.  Standardized tests and exams incorporated into evaluation include AMPAC mobility, cognition/orientation, ROM  and Strength.  Pt demonstrates a evolving clinical presentation due to abnormal vital sign response to activity and increased resting pain level.   Pt would continue to benefit from PT to address these deficits and increase functional independence.  RN/clin tech notified of this session's outcome, including pain levels remaining high with activity, along with vitals with activity.    Pt did report high levels of pain upon PT entering room, however pt did agree to work with therapy. Able to assist pt to EOB with min x 1. EOB vitals 92/53, HR 57. Denied lightheadedness/dizziness. Able to perform sit<->stand, which did produce lightheadedness. Assisted pt back into sitting, then min x 1 into supine, assessed vitals again at 88/47, HR 57. Held on further activity secondary to vitals/symptoms. RN notified.    Complexity Level Hx and Co  morbidites Examination Clinical Decision Making Clinical Presentation   Moderate   1-2 factors 3 or more   Several options Evolving, plan may alter     Goals  Goal Formulation: With patient/family  Time for Goal Acheivement: 7 visits  Goals: Select goal  Pt Will Go Supine To Sit: with supervision  Pt Will Perform Sit To Supine: with supervision  Pt Will Perform Sit to Stand: with supervision  Pt Will Transfer to Toilet: with stand by assist  Pt Will Transfer to Tub/Shower: with stand by assist  PT Will Demonstrate Car Transfer Technique: with minimal assist  Pt Will Ambulate: 151-200 feet;with  supervision  Pt Will Go Up / Down Stairs: 6-10 stairs;with stand by assist  Pt Will Perform Home Exer Program: with stand by assist     Rehabilitation Potential  Prognosis: Good;With continued PT status post acute discharge      Plan  Risks/Benefits/POC Discussed with Pt/Family: With patient/family  Patient Goal:  (walk dog, dance, exercises, drive motorcycle)  Treatment/Interventions: Exercise;Gait training;Stair training;Neuromuscular re-education;Functional transfer training;LE strengthening/ROM;Endurance training;Patient/family training;Equipment eval/education;Bed mobility  PT Frequency: twice a day  The patient and family participate and agree to the Care Plan and Goals.      Additional patient/coach education provided  per Education Tab.    Recommendation  Discharge Recommendation: Home with supervision;Home with home health PT  PT - Next Visit Recommendation: 07/11/17  PT Frequency: twice a day    SUBJECTIVE    Patient is agreeable to participation in the therapy session. Family and/or guardian are agreeable to patient's participation in the therapy session.  Patient Goal  Patient Goal:  (walk dog, dance, exercises, drive motorcycle)    Pain Assessment  Pain Assessment: Numeric Scale (0-10)  Pain Score: 8-severe pain  POSS Score: Awake and Alert  Pain Location: Hip  Pain Orientation: Left  Pain Descriptors: Aching;Discomfort  Pain Frequency: Increases with movement  Effect of Pain on Daily Activities: mild  Patient's Stated Comfort Functional Goal: 4-moderate pain  Pain Intervention(s): Repositioned;Cold applied;Ambulation/increased activity          OBJECTIVE     Patient is in bed  with IV, dressings, SCDs and 2Liter O2 NC, seen Day of Surgery.    Observation of patient:     Cognition/Neuro Status  Arousal/Alertness: Appropriate responses to stimuli  Attention Span: Appears intact  Memory: Appears intact  Following Commands: Follows one step commands without difficulty  Safety Awareness: minimal verbal  instruction  Insights: Fully aware of deficits  Problem Solving: minimal assistance    Vitals: as per assessment    Musculoskeletal Examination  Gross ROM  Right Lower Extremity ROM:  (WFL DF/PF AROM)  Left Lower Extremity ROM:  (WFL DF/PF AROM)                                  Sensation is intact , to light touch, bilateral lower extremities    Gross Strength  Right Lower Extremity Strength:  (4/5 DF, 4/5 great toe ext, at least 3/5 knee ext)  Left Lower Extremity Strength:  (4/5 DF, 4/5 great toe ext, 3/5 knee ext)                AM-PACT Inpatient Short Forms  Inpatient AM-PACT Performed? (PT): Basic Mobility Inpatient Short Form  AM-PACT "6 Clicks" Basic Mobility Inpatient Short Form  Turning Over in Bed: A little  Sitting Down On/Standing From Armchair: A little  Lying on Back to Sitting on Side of Bed: A little  Assist Moving to/from Bed to Chair: A little  Assist to Walk in Hospital Room: A little  Assist to Climb 3-5 Steps with Railing: A lot  PT Basic Mobility Raw Score: 17  CMS 0-100% Score: 50.57%    Functional Mobility  Supine to Sit: Minimal Assist  Scooting to HOB: Minimal Assist  Scooting to EOB: Minimal Assist  Sit to Supine: Minimal Assist  Sit to Stand: Contact Guard Assist  Stand to Sit: Contact Guard Assist            Locomotion  Ambulation: Not performed   PMP - Progressive Mobility Protocol   PMP Activity: Step 5 - Chair       Pt did not perform self care activities at this time.           Participation and Endurance  Participation Effort: good  Endurance: Endurance does not limit participation in activity    Patient is in bed with call bell in reach  and all needs provided.       Bobetta Lime, PT

## 2017-07-11 NOTE — OR Nursing (Signed)
Intraoperative medications reviewed by anesthesia prior to administering to field.

## 2017-07-11 NOTE — Discharge Summary (Signed)
Discharge Summary    Patient ID:  Tracy [name]  [id number]  67 y.o.  [birth year]    Admit date: 07/11/2017    Discharge date and time: 07/12/2017  3:21 PM     Admitting Physician: Lane Hacker, MD     Admission Diagnoses: Left hip osteoarthritis    Discharge Diagnoses: Left hip osteoarthritis    Operative Procedures: Left total hip arthroplasty    Indication for Admission: The patient has a history of left hip osteoarthritis that has become progressively worse and the patient has elected to proceed with left total hip arthroplasty.    Hospital Course: The patient was admitted to the hospital on day of surgery and underwent left total hip arthroplasty. Post-operatively, the patient was transferred to the orthopaedic floor.  They received prophylactic intravenous antibiotics.  DVT prophylaxis included SCDs, early ambulation, and ASA was started the evening of surgery.  The patient was able to tolerate a regular diet and their pain was reasonably well controlled.  The patient progressed well throughout the hospitalization and was deemed stable for discharge on above listed date.    Disposition: Home or Self Care    Discharge Medications:       Medication List      START taking these medications    aspirin EC 81 MG EC tablet  Take 1 tablet (81 mg total) by mouth 2 (two) times daily with meals     HYDROcodone-acetaminophen 5-325 MG per tablet  Commonly known as:  NORCO  Take 1-2 tablets by mouth every 4 (four) hours as needed for Pain     ondansetron 4 MG tablet  Commonly known as:  ZOFRAN  Take 1 tablet (4 mg total) by mouth every 12 (twelve) hours as needed for Nausea     traMADol 50 MG tablet  Commonly known as:  ULTRAM  Take 1 tablet (50 mg total) by mouth every 6 (six) hours as needed for Pain        CONTINUE taking these medications    co-enzyme Q-10 30 MG capsule     desoximetasone 0.05 % cream  Commonly known as:  TOPICORT     ezetimibe 10 MG tablet  Commonly known as:  ZETIA     * FLUoxetine 10 MG  tablet  Commonly known as:  PROzac     * FLUoxetine 20 MG capsule  Commonly known as:  PROzac     fluticasone 50 MCG/ACT nasal spray  Commonly known as:  FLONASE     gabapentin 300 MG capsule  Commonly known as:  NEURONTIN     glucosamine-chondroitin 500-400 MG tablet     levothyroxine 88 MCG tablet  Commonly known as:  SYNTHROID, LEVOTHROID     multivitamin with minerals tablet     Omega-3 Fish Oil 500 MG Caps     pantoprazole 40 MG tablet  Commonly known as:  PROTONIX  Take 1 tablet (40 mg total) by mouth every morning before breakfast.     pramipexole 0.25 MG tablet  Commonly known as:  MIRAPEX     REPATHA SURECLICK 140 MG/ML Soaj  Generic drug:  Evolocumab     SYSTANE NIGHTTIME Oint     TURMERIC PO     Vitamin D 1000 units capsule     VITAMIN E PO        * This list has 2 medication(s) that are the same as other medications prescribed for you. Read the directions carefully, and ask your doctor or other care  provider to review them with you.            STOP taking these medications    acetaminophen 650 MG CR tablet  Commonly known as:  TYLENOL     oxyCODONE-acetaminophen 5-325 MG per tablet  Commonly known as:  PERCOCET           Where to Get Your Medications      Information about where to get these medications is not yet available    Ask your nurse or doctor about these medications   aspirin EC 81 MG EC tablet   HYDROcodone-acetaminophen 5-325 MG per tablet   ondansetron 4 MG tablet   traMADol 50 MG tablet         Patient Instructions:     The patient will notify me for any increased bleeding, drainage, or progressively worsening pain, or other concerning symptoms.  They have been instructed to report to the emergency room immediately for any chest pain or shortness of breath.     Activity: WBAT on LLE    DVT prophylaxis: ASA 81 mg PO BID x 4 weeks    Wound Care: The patient has a waterproof dressing in place.  They can shower with the dressing in place.  They will remove the dressing in 7-10 days.  At that  time, they can get the incision wet in the shower, and will pat dry afterwards.    Follow-up visit at [facility] 4 weeks post-op.    Signed:  Tollie Pizza Rickiya Picariello  07/12/2017  4:59 PM

## 2017-07-11 NOTE — Discharge Instr - AVS First Page (Addendum)
Discharge Instructions   Total Hip Replacement   [facility]  Phone:  [phone]     Physical therapy 3x weekly Total Joint Replacement protocol.   Continue with individual exercise program until follow-up visit with Dr Ahmed Prima.   FULL weight bearing as tolerated with walker or two crutches for at least one week, then transition to a cane   * No extreme range of motion   *Active exercises: Abduction/Flexion/Heelslides   *Passive exercises: Abduction/Flexion/Heelslides     Download the [facility] PostOp Care app to your smart phone or tablet.  Use the App daily to check in with the [facility].  Call Tristan Schroeder if you have difficulty using the app. [phone]    Dissolvable sutures closing your incision and it is covered by a waterproof dressing   Notify Dr. Ahmed Prima of any drainage after post op day # 5,    You may shower & allow water to run over dressing/incision, then pat dry,   if there is no drainage from the incision.  Remove waterproof dressing in 7-10 days    No lotions or creams on incision until follow up with Dr. Ahmed Prima.     Wear Venapro leg system 16-20 hours per day x 2 weeks.    Remove Venapro twice a day and inspect skin.    Notify MD of any skin breakdown    No dental work for 90 days after surgery.    Assess pain level every 2 to 4 hours and medicate accordingly. Contact MD for Rx refills.     [facility] to notify patient of follow up appointment with Dr. Lane Hacker  Call the [facility] for any questions or concerns [phone]  Tristan Schroeder, RN Total Joint Care Coordinator [phone]          [facility] Hip and Knee Replacement Discharge Instructions      SWELLING  It is normal to have some swelling in your lower legs after surgery. Walking every hour and doing your exercises will help strengthen your muscles and resolve the swelling. If you have swelling, we recommend that you lie down every two hours, elevate your legs with  pillows, and apply ice for 15 minutes. If the swelling does not go away overnight, please call your doctors office.      SHOWERS  You are permitted to shower at home as long as your incision is clean and intact. If you have a clear plastic dressing, leave that in place for 10 days and just let the water run over the dressing. If you have a cloth or gauze dressing, remove it before the shower and replace it after the shower. Do not soak in a tub or swim until you doctor says it's okay.      ACTIVITY  You will be using an assistive device ( walker, crutches or cane). Your physical therapist will help you with this. Most patients are able to get in and out of bed, use the restroom and go up and down stairs when they go home. It's a good idea to have someone with you in case you need help for the first week. We recommend that you get up and move around every 1-1.5 hours while you are awake. This will help with stiffness and reduce the chance of blood clots.      PAIN  You may continue to have pain or soreness for several weeks after surgery. Gradually taper the frequency and amount  of narcotic pain medication you are taking based on the amount of pain or soreness you have. Extra strength acetaminophen can be used but should not exceed 4000mg  in one day. Apply ice to the incision for 15 minutes after activity to help ease soreness.      MEDICATIONS  Most patients are discharged with several prescriptions. There are 2 main categories, pain pills and pills to help prevent blood clots.  Pain pills - take these if you need them. When taking narcotic pain medications, be sure to drink plenty of water and use stool softeners, such as Pericolace or Colace, as these pain medications can be constipating.  Blood Thinners - could be aspirin, coumadin, lovenox or mechanical pumps. Make sure you take the medicine properly or use the pumps as prescribed.  Vitron C - this is an over the counter medication that will help to build back  your blood count after surgery. We recommend this for most patients.      CALLING THE OFFICE  Call the doctor if you have any additional questions or concerns. After hours one of the fellows that helped with the surgery will return your call. During working hours the staff can answer simple questions. If you have concerns not addressed to your satisfaction by the staff then ask for your doctor's nurse to return your call.

## 2017-07-11 NOTE — Plan of Care (Signed)
Problem: Hip Surgery  Goal: Free from Infection  Outcome: Progressing   07/11/17 1256   Goal/Interventions addressed this shift   Free from infection Monitor/assess vital signs;Maintain temperature within desired parameters;Assess for signs and symptoms of infection;Assess surgical dressing, reinforce or change as needed per order;Teach/reinforce use of incentive spirometer 10 times per hour while awake, cough and deep breath as needed;Administer and discontinue antibiotics per SCIP measures    Monitored and assessed VS. Afebrile. Dressing is clean, dry and intact. Pt. Is without foley catheter. Awaiting to void. Reinforced use of IS. Able to reach 2000.  Goal: Nutritional Intake is Adequate  Outcome: Progressing   07/11/17 1256   Goal/Interventions addressed this shift   Nutritional intake is adequate Advance diet as ordered and tolerated     Goal: Neurovascular Status is Stable  Outcome: Progressing   07/11/17 1256   Goal/Interventions addressed this shift   Neurovascular status is stable  Assess and document plantar/dorsiflexion;Monitor/assess neurovascular status (pulses, capillary refill, pain, paresthesia, presence of edema);VTE prevention: administer anticoagulant(s) and/or apply anti-embolism stockings/devices as ordered     Goal: Hemodynamic Stability  Outcome: Progressing   07/11/17 1256   Goal/Interventions addressed this shift   Hemodynamic stability  Monitor/assess vital signs;Maintain temperature within desired parameters;Monitor/assess lab values and report abnormal values;Monitor/assess surgical drainage output if drain is present     Goal: Mobility/activity is maintained at optimum level for patient  Outcome: Progressing   07/11/17 1256   Goal/Interventions addressed this shift   Mobility/activity is maintained at optimal level for patient Teach/review/reinforce hip precautions with patient/patient care companion;Teach/review/reinforce exercises (ankle pumps, quad sets, gluteal  sets);Teach/review/reinforce weight bearing status with patient/patient care companion;Evaluate if patient comfort function goal is met;Utilize special equipment (trapeze, abduction pillow, regular pillow, walker) as needed and as ordered     Goal: Pain at adequate level as identified by patient  Outcome: Progressing   07/11/17 1256   Goal/Interventions addressed this shift   Pain at adequate level as identified by patient  Identify patient comfort function goal;Evaluate if patient comfort function goal is met;Ice therapy as indicated;Administer oral pain medications as prescribed;Utilize special equipment (trapeze, abduction pillow, regular pillow, walker) as needed and as ordered   Patient reported that pain in manageable Pt's comfort pain level is 4/10. Medicated with PRN Norco . Ice packs applied to site. Repositioned patient.     Problem: Safety  Goal: Patient will be free from injury during hospitalization  Outcome: Progressing   07/11/17 1256   Goal/Interventions addressed this shift   Patient will be free from injury during hospitalization  Provide and maintain safe environment;Assess patient's risk for falls and implement fall prevention plan of care per policy;Hourly rounding;Assess for patients risk for elopement and implement Elopement Risk Plan per policy;Provide alternative method of communication if needed (communication boards, writing);Include patient/ family/ care giver in decisions related to safety   Fall prevention safety plan discussed with pt. Hourly rounding done. Bed in lowest position. Call bell within reach. Overhead table near/across the bed. Stay with patient during toileting.     Problem: Altered GI Function  Goal: Elimination patterns are normal or improving  Outcome: Progressing    Goal: Nutritional intake is adequate  Outcome: Progressing   07/11/17 1256   Goal/Interventions addressed this shift   Nutritional intake is adequate Allow adequate time for meals   Hx of GERD and Dysphagia  (co-morbidity) HOB elevated when pt is drinking and eating. Encourage pt to eat slowly.     Problem: Fluid  and Electrolyte Imbalance/ Endocrine  Goal: Fluid and electrolyte balance are achieved/maintained  Outcome: Progressing   07/11/17 1256   Goal/Interventions addressed this shift   Fluid and electrolyte balance are achieved/maintained Monitor intake and output every shift;Monitor/assess lab values and report abnormal values;Provide adequate hydration;Assess and reassess fluid and electrolyte status   Hx of Hypothyroidism (co-morbidity) Pt is on Synthroid.    Problem: Nutrition  Goal: Nutritional intake is adequate  Outcome: Progressing   07/11/17 1256   Goal/Interventions addressed this shift   Nutritional intake is adequate Allow adequate time for meals   Hx of GERD and Dysphagia (co-morbidity) HOB elevated when pt is drinking and eating. Encourage pt to eat slowly.     Problem: Inadequate Airway Clearance  Goal: Normal respiratory rate/effort achieved/maintained  Outcome: Progressing   07/11/17 1256   Goal/Interventions addressed this shift   Normal respiratory rate/effort achieved/maintained Plan activities to conserve energy: plan rest periods   Hx of Asthma. Pt is currently with O2 running at 2L via Nasal cannula.

## 2017-07-11 NOTE — Anesthesia Postprocedure Evaluation (Signed)
Anesthesia Post Evaluation    Patient: [name]    Procedure(s):  ARTHROPLASTY, HIP TOTAL    Anesthesia type: CSE    Last Vitals:   Vitals:    07/11/17 0830   BP:    Pulse: 73   Resp: 16   Temp:    SpO2: 97%       Anesthesia Post Evaluation:     Patient Evaluated: bedside  Patient Participation: complete - patient participated  Level of Consciousness: awake    Pain Management: adequate  multimodal analgesia used between 6 hours prior to anesthesia start to PACU discharge    Airway Patency: patent    Anesthetic complications: No      PONV Status: none    Cardiovascular status: hemodynamically stable  Respiratory status: nasal cannula  Hydration status: euvolemic        Signed by: Camillo Flaming, 07/11/2017 10:27 AM

## 2017-07-11 NOTE — Progress Notes (Addendum)
[facility] INTERNAL MEDICINE PROGRESS NOTE    Date Time: 07/11/17 12:36 PM  Patient Name: Tracy [name],Tracy [name]        CC/Subjective:   Tracy [name] is a 67 y.o. female w/ hypertension, hyperlipidemia, RLS, hypothyroid, anxiety, depression, hx SAH.   S/p L THA  07/11/2017  Pt seen in room w/ family, states she is "feeling okay."    Medications:      Scheduled Meds: PRN Meds:      aspirin EC 81 mg Oral BID   dexamethasone 2 mg Oral 4 times per day   ezetimibe 10 mg Oral QHS   ferrous sulfate 324 mg Oral Daily with dinner   FLUoxetine 10 mg Oral Daily   FLUoxetine 20 mg Oral Daily   FLUoxetine 20 mg Oral QAM   [START ON 07/12/2017] fluticasone 1 spray Each Nare Daily   [START ON 07/12/2017] levothyroxine 88 mcg Oral Daily at 0600   pantoprazole 40 mg Oral Daily before dinner   pramipexole 0.75 mg Oral QHS   senna-docusate 2 tablet Oral QHS   tranexamic acid      vancomycin 15 mg/kg Intravenous Q12H   vitamin C 500 mg Oral Daily with dinner         Continuous Infusions:  . lactated ringers     . lactated ringers     . lactated ringers        diphenhydrAMINE 25 mg Q12H PRN   HYDROcodone-acetaminophen 1 tablet Q4H PRN   HYDROcodone-acetaminophen 1 tablet Q4H PRN   magnesium hydroxide 10 mL Daily PRN   naloxone 0.4 mg PRN   ondansetron 4 mg Q8H PRN   Or     ondansetron 4 mg Q8H PRN   promethazine 25 mg Q6H PRN   Or     promethazine 25 mg Q6H PRN   Or     promethazine 6.25 mg Q6H PRN   traMADol 50 mg Q6H PRN         I personally reviewed all of the medications      Review of Systems:   A comprehensive review of systems was obtained from chart review and the patient  General ROS: no unexplained weight loss/gain, no fevers, chills or rigors  Ophthalmic ROS: negative  Endocrine ROS: negative  Respiratory ROS: no cough, shortness of breath, or wheezing  Cardiovascular ROS: no chest pain or dyspnea on exertion  Gastrointestinal ROS: no abdominal pain or recent change in bowel habits, no nausea/vomiting  Genito-Urinary ROS:  no c/o dysuria, trouble voiding, or hematuria - awaiting 1st void  Musculoskeletal ROS: left hip pain- controlled with meds  Neurological ROS: no TIA or stroke symptoms, denies dizziness  Dermatological ROS: negative for eczema, pruritis, and rash    Physical Exam:     Vitals:    07/11/17 1045 07/11/17 1100 07/11/17 1115 07/11/17 1204   BP: 129/72 133/64 136/65 120/60   Pulse: 69 67 67 69   Resp: 20 18 19 18    Temp: 97.9 F (36.6 C)  98 F (36.7 C) 99.1 F (37.3 C)   TempSrc:   Temporal Artery Temporal Artery   SpO2: 99% 98% 99% 100%   Weight:       Height:             Intake and Output Summary (Last 24 hours) at Date Time    Intake/Output Summary (Last 24 hours) at 07/11/17 1236  Last data filed at 07/11/17 0959   Gross per 24 hour   Intake  1050 ml   Output              250 ml   Net              800 ml       General Appearance -  alert, well appearing, and in no distress  Mental Status  -awake, oriented to person, place, and time  Eyes - pupils equal and reactive, extraocular eye movements intact  CV - normal rate, regular rhythm, normal S1, S2, no murmurs,  peripheral pulses normal, no pedal edema, no clubbing or cyanosis  Resp  - clear to auscultation, no wheezes, rales or rhonchi, symmetric air entry  GI - bowel sounds normal, abdomen is soft, nontender, non distended  Neuro - alert, oriented, normal speech, no focal findings or movement disorder noted  Musculoskeletal - laying in bed  Psych - appropriate affect   Integument - normal coloration and turgor, all visible areas of extremities checked, no rashes      Consultant note reviewed.    Labs:                       Invalid input(s): PLT3          Invalid input(s): FREET4        Rads:     Radiology Results (24 Hour)     Procedure Component Value Units Date/Time    XR Pelvis Portable [[id number]] Collected:  07/11/17 1028    Order Status:  Completed Updated:  07/11/17 1032    Narrative:       CLINICAL INDICATION: hardware evaluation.s/p THA  left-sided    FINDINGS:      AP view of the pelvis with AP view of the left femur. The  joint prothesis is in good position. There is no fracture or  dislocation. Postsurgical changes are seen in the soft tissues.       Impression:         Good position of the joint prothesis. No fracture or  dislocation.    Laurena Slimmer, MD   07/11/2017 10:28 AM    XR femur left 1 view [[id number]] Collected:  07/11/17 1028    Order Status:  Completed Updated:  07/11/17 1032    Narrative:       CLINICAL INDICATION: hardware evaluation.s/p THA left-sided    FINDINGS:      AP view of the pelvis with AP view of the left femur. The  joint prothesis is in good position. There is no fracture or  dislocation. Postsurgical changes are seen in the soft tissues.       Impression:         Good position of the joint prothesis. No fracture or  dislocation.    Laurena Slimmer, MD   07/11/2017 10:28 AM                  Medical History     Past Medical History:   Diagnosis Date   . Anxiety    . Arthritis    . Asthma    . Bilateral cataracts     early stages   . Depression     on prozac many yrs stable sees therapist   . Difficulty walking    . Diverticulitis    . Dysphagia    . Dyspnea on exertion    . Ear, nose and throat disorder     seasonal allergies   . Eczema    .  Gastroesophageal reflux disease    . Hard to intubate    . Health care maintenance     colonscopy 1/08-5 yr f/u dr Durwin Nora  pap-1/12  mammo-, dexa-1/12,mammo 3/13  tetanius shot 3/12, with MMR and hep A/B  had pneumovax age 62  NL CXR 05/30/11   . History of colonoscopy 2007    MGM colon cancer 70  nl colonscopy 2007 dr Durwin Nora   . Hyperlipidemia     sees cards dr. Neomia Dear  9/12 chol 274 trigs 225 H53/L176 had myositis with statins,niacin-now on zetia had nl carotid duplex/thallium 1/12 Nl thallium-6/13  11/13 chol 260 H63/L173,ck= 305,cmp-nl creat 0.95-Referred to rheum 5/`14 re elevated cpk Saw Dr Neomia Dear 9/14-myalgia gone ,off tricor,but ck still even higher-echo/thallium ordred   .  Hypertension    . Hypothyroidism    . Osteoporosis     o'penia had been on reclast per gyn and ergocalciferol early 2012. dexa 1/12-T scores -1.1, -0.5   . Restless legs syndrome    . Routine history and physical examination of adult 02/2010    last Tetanus shot 2012   . SAH (subarachnoid hemorrhage)    . Syncope and collapse    . Thyroid disease     hypothyroid on synthroid   . Urinary tract infection               Assessment and Plan:        Patient Active Problem List   Diagnosis    Hip arthritis, s/p  L THA - continue plan, DVT prophylaxis, pain management, and physical therapy as per Ortho   . Thyroid disease/hypothyroidism -continue levothyroxine   . Depression/Anxiety -continue medication   . Hyperlipidemia-continue Zetia   . SDH (subdural hematoma) hx -cleared by neurology for surgery   .  Asthma -no medications on preop list.   . Hypertension-follow blood pressure, no meds pre-op         Signed by: Sheryle Hail, NP

## 2017-07-11 NOTE — H&P (View-Only) (Signed)
Consults   CONSULTATION    Date Time:06/26/2017 10:37 AM  Patient Name: Tracy [name],Tracy [name]  Requesting Physician: Ahmed Prima      Reason for Consultation:   Medical clearance for surgery.      History:   Today I am seeing patient Tracy [name] Mentzer who is a 67 y.o. female for a pre-operative appointment per the request of Dr. Ahmed Prima.  The patient is scheduled for an elective Left THA for worsening joint pain.   Patient with left hip pain-osteoarthritis x a year or more that has progressively gotten worse.  No relief with pain medication at this time.  Causing difficulty with ambulation and activities of daily living.  Now requiring surgical intervention.  Patient with history of osteoporosis hypothyroidism hyperlipidemia and asthma  however stable at this time.  Patient denies any chest pain or cardiac complaints.  Denies any shortness of breath or respiratory complaints.  No other associated symptoms reported.  Patient on medical management and tolerating without any complications.Also with history of subarachnoid hemorrhage which is stable and patient has received a neurological clearance to proceed with surgery.  She feels well overall without any complaints. METS score>4.      Chief Complaint:   No chief complaint on file.      Problem List:     Patient Active Problem List   Diagnosis   . Thyroid disease   . Depression   . Hyperlipidemia   . Health care maintenance   . Osteoporosis   . History of colonoscopy   . SDH (subdural hematoma)   . Subarachnoid hemorrhage   . SAH (subarachnoid hemorrhage)   . Dysphagia       Past Medical History:     Past Medical History:   Diagnosis Date   . Anxiety    . Arthritis    . Asthma    . Bilateral cataracts     early stages   . Depression     on prozac many yrs stable sees therapist   . Difficulty walking    . Diverticulitis    . Dysphagia    . Dyspnea on exertion    . Ear, nose and throat disorder     seasonal allergies   . Eczema    . Gastroesophageal reflux disease    . Hard to  intubate    . Health care maintenance     colonscopy 1/08-5 yr f/u dr Durwin Nora  pap-1/12  mammo-, dexa-1/12,mammo 3/13  tetanius shot 3/12, with MMR and hep A/B  had pneumovax age 47  NL CXR 05/30/11   . History of colonoscopy 2007    MGM colon cancer 70  nl colonscopy 2007 dr Durwin Nora   . Hyperlipidemia     sees cards dr. Neomia Dear  9/12 chol 274 trigs 225 H53/L176 had myositis with statins,niacin-now on zetia had nl carotid duplex/thallium 1/12 Nl thallium-6/13  11/13 chol 260 H63/L173,ck= 305,cmp-nl creat 0.95-Referred to rheum 5/`14 re elevated cpk Saw Dr Neomia Dear 9/14-myalgia gone ,off tricor,but ck still even higher-echo/thallium ordred   . Hypertension    . Hypothyroidism    . Osteoporosis     o'penia had been on reclast per gyn and ergocalciferol early 2012. dexa 1/12-T scores -1.1, -0.5   . Restless legs syndrome    . Routine history and physical examination of adult 02/2010    last Tetanus shot 2012   . SAH (subarachnoid hemorrhage)    . Syncope and collapse    . Thyroid disease  hypothyroid on synthroid   . Urinary tract infection        Past Surgical History:     Past Surgical History:   Procedure Laterality Date   . COLONOSCOPY     . EGD N/A 08/28/2016    Procedure: EGD;  Surgeon: Lestine Mount, MD;  Location: [facility];  Service: Gastroenterology;  Laterality: N/A;   . EYE SURGERY  2011   . HYSTERECTOMY      complete laparoscopic hyterectomy,rectocele/cystocele repair 09/12/11 in [place]   . maxillofacial  1981   . NASAL SEPTUM SURGERY         Family History:     Family History   Problem Relation Age of Onset   . Osteoporosis Mother    . COPD Mother    . Diabetes Mother    . Arthritis Mother    . Cancer Father    . Cancer Paternal Grandfather        Social History:     Social History     Social History   . Marital status: Single     Spouse name: N/A   . Number of children: N/A   . Years of education: N/A     Occupational History   . Not on file.     Social History Main Topics   . Smoking status: Never Smoker    . Smokeless tobacco: Never Used   . Alcohol use Yes      Comment: glass of wine once/month   . Drug use: No   . Sexual activity: Not on file     Other Topics Concern   . Not on file     Social History Narrative   . No narrative on file       Allergies:     Allergies   Allergen Reactions   . Fenofibrate Swelling   . Niacin Swelling   . Onion Other (See Comments)     Gi distress   . Peppers Other (See Comments)     GI distress   . Statins      Muscle pain and liver enzymes elevated   . Sulfa Antibiotics Hives   . Sulfa Drugs Cross Reactors Hives   . Colesevelam Rash   . Erythromycin Rash   . Neosporin [Neomycin-Bacitracin Zn-Polymyx] Rash   . Penicillins Rash       Medications:     Prior to Admission medications    Medication Sig Start Date End Date Taking? Authorizing Provider   acetaminophen (TYLENOL) 650 MG CR tablet Take 650 mg by mouth every 8 (eight) hours as needed for Pain.   Yes [provider]   Cholecalciferol (VITAMIN D) 1000 UNITS capsule Take 1,000 Units by mouth every morning       Yes [provider]   co-enzyme Q-10 30 MG capsule Take 30 mg by mouth daily as needed       Yes [provider]   desoximetasone (TOPICORT) 0.05 % cream Apply topically as needed       Yes [provider]   Evolocumab (REPATHA SURECLICK) 140 MG/ML Solution Auto-injector 1 mL Every 2 weeks   Yes [provider]   ezetimibe (ZETIA) 10 MG tablet Take 10 mg by mouth every evening       Yes [provider]   FLUoxetine (PROZAC) 10 MG tablet Take 10 mg by mouth every morning       Yes [provider]   FLUoxetine (PROZAC) 20 MG capsule  Take 20 mg by mouth every morning   Yes [provider]   fluticasone (FLONASE) 50 MCG/ACT nasal spray INSTILL SPRAY IN NOSTRIL TWICE A DAY 01/16/16  Yes [provider]   glucosamine-chondroitin 500-400 MG tablet Take 1 tablet by mouth as needed       Yes [provider]   levothyroxine (SYNTHROID,  LEVOTHROID) 88 MCG tablet Take 88 mcg by mouth every morning       Yes [provider]   Multiple Vitamins-Minerals (MULTIVITAMIN WITH MINERALS) tablet Take 1 tablet by mouth every morning       Yes [provider]   Omega-3 Fatty Acids (OMEGA-3 FISH OIL) 500 MG Cap Take by mouth daily as needed       Yes [provider]   oxyCODONE-acetaminophen (PERCOCET) 5-325 MG per tablet Take 1 tablet by mouth every 6 (six) hours as needed       Yes [provider]   pantoprazole (PROTONIX) 40 MG tablet Take 1 tablet (40 mg total) by mouth every morning before breakfast. 08/28/16  Yes Chaudhry, Amal, MD   pramipexole (MIRAPEX) 0.25 MG tablet Take 0.5 mg by mouth nightly       Yes [provider]   TURMERIC PO Take by mouth every morning       Yes [provider]   VITAMIN E PO Take by mouth every morning       Yes [provider]   White Petrolatum-Mineral Oil (SYSTANE NIGHTTIME) Ointment every evening 11/18/15  Yes [provider]   gabapentin (NEURONTIN) 300 MG capsule TAKE 1 TABLET AT NIGHT 01/19/16   [provider]       Review of Systems:   A comprehensive review of systems was obtained from chart review and the patient.    General ROS: negative for - malaise and fatigue  Psychological ROS: negative for - disorientation or suicidal ideation  Ophthalmic ROS: negative for - blurry vision  ENT ROS: negative for - headaches  Allergy and Immunology ROS: negative  Hematological and Lymphatic ROS: negative for - bleeding problems  Endocrine ROS: negative for - malaise/lethargy  Respiratory ROS: no cough, shortness of breath, or wheezing  Cardiovascular ROS: no chest pain or dyspnea on exertion  Gastrointestinal ROS: no abdominal pain, change in bowel habits, or black or bloody stools  Genito-Urinary ROS: no dysuria, trouble voiding, or hematuria  Musculoskeletal ROS: positive for - joint pain  Neurological ROS: no TIA or stroke symptoms      Physical  Exam:   BP 126/69, heart rate 50, temperature 96.6, saturation 97% on room air.    Constitutional-normal  Eyes-normal   ENMT-normal  CV -normal  Resp-normal  GI-normal  Skin-normal   Neuro-normal  Musculoskeletal - Left Hip OA changes with tenderness and painful ROM      Labs:     Results     ** No results found for the last 24 hours. **      previous labs reviewed and noted.    Rads:     Radiology Results (24 Hour)     ** No results found for the last 24 hours. **          EKG: Normal.    Assessment/Plan:   Pre-operative Evaluation.  Osteoarthritis of Left Hip.  Patient has no cardiopulmonary contraindication to surgery.  Patient is a low surgical risk.  History of Asthma. Continue outpatient medications.  Hyperlipidemia - plan to continue current medication.  Hypothyroidism -  plan to continue current medication.  History of Depression/Anxiety-Disorder. Continue current medication.  Osteoporosis-continue meds  Restless leg syndrome-patient on Mirapex  GERD continue PPI   History of subarachnoid hemorrhage-patient was seen by her neurologist and neurological clearance was given.  Home meds reviewed and the patient was advised which medications to take the am of surgery.  No ASA/NSAID's, OTC Meds 10 days prior to surgery as per preop instructions.DVT and GI prophylaxis per orthopedics. Med Rec completed for post-op today and discussed with patient. Pt is medically cleared. Will follow patient post-operatively. Thank you for consultation.    Additional Recommendations:   CBC/BMP POD #1    Documentation available for the review of Dr. Ahmed Prima    Signed ZO:XWRUEAVW Darrel Reach, MD

## 2017-07-11 NOTE — Plan of Care (Addendum)
DATE: 07/11/2017  JOINT REPLACEMENT DISCHARGE ORDERS    Discharge Diagnosis: S/P LEFT THA 07/11/2017    [facility]    _X__Rn to check wound and remove Tegaderm in 7-10 days, if no drainage. Review medications and post op instructions.  Notify MD of any drainage after Post op Day #5    Pt to wear Venapro leg system 16-20 hours per day x 2 weeks.  Remove Venapro twice a day and inspect skin.    Notify MD of any skin breakdown.    _X_Physical Therapy 3x week per Total Joint Replacement protocol.  Continue with individual exercise program until follow up.    Full weightbearing: Using Walker or Crutches may transition to cane per therapy  Exercises: (per Total Joint Replacement protocol and individual program; send copy home with Patient)  Active exercises:    _x__Abduction __x_Flexion_x__Heelslides  Passive exercises: _x__Abduction _x__Flexion_x__Heelslides  Precautions:  _x_ No Extremes of Motion    Follow up appointment with Dr Ahmed Prima in 4 weeks    Drug Sensitivities: Fenofibrate, Niacin, Onion, Peppers, Statins, Sulfa Antibiotics, Sulfa Drugs Cross Reactors, Colesevelam, Erythromycin, Neosporin [Neomycin-bacitracin Zn-polymyx], Penicillins    Signature:Telephone order read back Dr Santa Genera rn  Date5/22/2019

## 2017-07-11 NOTE — Anesthesia Procedure Notes (Signed)
Combined Spinal epidural    Patient location during procedure: pre-op  Reason for block: Primary anesthetic in the OR  Requested by Surgeon: Yes   Start time: 07/11/2017 8:10 AM  End time: 07/11/2017 8:20 AM    Staffing  Anesthesiologist: Floydene Flock MARIE A  Performed: anesthesiologist     Pre-procedure Checklist   Completed: patient identified, site marked, surgical consent, pre-op evaluation, timeout performed, risks and benefits discussed, monitors and equipment checked and correct site  Timeout Completed:  07/11/2017 8:09 AM    CSE  Patient monitoring: Pulse oximetry, EKG, NIBP and Nasal cannula O2  Premedication: Yes  Patient position: sitting  Skin Local: lidocaine 1%    Number of attempts: 1                      Approach: midline      Needle Placement  Needle type: Touhy needle   Needle gauge: 17  Injection technique: LOR air  CSF Return: No  Blood Return: No  Intrathecal space entered with Pencan  Needle Gauge 25 G  CSF Return: Yes   Blood Return:No  Paresthesia Pain: no    Catheter Placement   Catheter type: side hole  Catheter size: 20 G  Catheter at skin depth: 10 cm  CSF Return: No  Blood Return: No      Pressure Paresthesia: no      Assessment   Sensory level: T6  Patient tolerated procedure well: Yes  Block Outcome: no complications and successful block

## 2017-07-12 ENCOUNTER — Encounter: Payer: Self-pay | Admitting: Orthopaedic Surgery

## 2017-07-12 LAB — GFR: EGFR: >60

## 2017-07-12 LAB — HEMOGLOBIN AND HEMATOCRIT, BLOOD
Hematocrit: 33.9 % — ABNORMAL LOW (ref 34.7–43.7)
Hgb: 11 g/dL — ABNORMAL LOW (ref 11.4–14.8)

## 2017-07-12 LAB — BASIC METABOLIC PANEL
Anion Gap: 7 (ref 5.0–15.0)
BUN: 9 mg/dL (ref 7–19)
CO2: 25 mEq/L (ref 22–29)
Calcium: 8.2 mg/dL — ABNORMAL LOW (ref 8.5–10.5)
Chloride: 105 mEq/L (ref 100–111)
Creatinine: 0.8 mg/dL (ref 0.6–1.0)
Glucose: 119 mg/dL — ABNORMAL HIGH (ref 70–100)
Potassium: 4.5 mEq/L (ref 3.5–5.1)
Sodium: 137 mEq/L (ref 136–145)

## 2017-07-12 MED ORDER — ASPIRIN 81 MG PO TBEC
81.00 mg | DELAYED_RELEASE_TABLET | Freq: Two times a day (BID) | ORAL | 0 refills | Status: AC
Start: 2017-07-12 — End: 2017-08-11

## 2017-07-12 MED ORDER — TRAMADOL HCL 50 MG PO TABS
50.00 mg | ORAL_TABLET | Freq: Four times a day (QID) | ORAL | 0 refills | Status: DC | PRN
Start: 2017-07-12 — End: 2020-10-12

## 2017-07-12 MED ORDER — ONDANSETRON HCL 4 MG PO TABS
4.00 mg | ORAL_TABLET | Freq: Two times a day (BID) | ORAL | 0 refills | Status: DC | PRN
Start: 2017-07-12 — End: 2020-10-12

## 2017-07-12 MED ORDER — HYDROCODONE-ACETAMINOPHEN 5-325 MG PO TABS
1.00 | ORAL_TABLET | ORAL | 0 refills | Status: DC | PRN
Start: 2017-07-12 — End: 2020-10-12

## 2017-07-12 NOTE — PT Progress Note (Signed)
Physical Therapy Note    Physical Therapy Treatment    Patient:Tracy [name]   ZOX#:[id number]   Unit:[facility]  Bed:  M456/M456.01    PT Received On: 07/12/17 Start Time: 1340 Stop Time: 1500  Time Calculation (min): 80 min PT Visit Number: 3/7    Medical Diagnosis:  Primary localized osteoarthritis of left hip [M16.12]  Precautions  Weight Bearing Status: LLE WBAT  Total Hip Replacement: no extremes in ROM (Left Hip Replacement by Dr. Ahmed Prima on 06/06/17)  Precaution Instructions Given to Patient: Yes  MD orders for Exercises: active and passive    Patient's medical condition is appropriate for Physical Therapy  intervention at this time.  Patient cleared by nurse to participate in PT session, nursing reports no changes.    ASSESSMENT/PLAN   Patient has been cleared by PT for a safe [place] hom with help from sister. All questions answered and patient feels eager to go home.  RN/clin tech notified of this sessions outcome, including patient has cleared PT for a safe [place] home.  Cleared PT, adequate for next level of skilled PT services.    Goals per Eval/Re-eval:   Goals  Goal Formulation: With patient/family  Time for Goal Acheivement: 7 visits  Goals: Select goal  Pt Will Go Supine To Sit: with supervision, Goal met (5/23)  Pt Will Perform Sit To Supine: with supervision, Goal met (5/23)  Pt Will Perform Sit to Stand: with supervision, Goal met (5/23)  Pt Will Transfer to Toilet: with stand by assist, Goal met (5/23)  Pt Will Transfer to Tub/Shower: with stand by assist, Goal met (5/23)  PT Will Demonstrate Car Transfer Technique: with minimal assist, Goal met (5/23)  Pt Will Ambulate: 151-200 feet, with supervision, Goal met (5/23)  Pt Will Go Up / Down Stairs: 6-10 stairs, with stand by assist, Goal met (5/23)  Pt Will Perform Home Exer Program: with stand by assist, Goal met (5/23)         Plan  Risks/Benefits/POC Discussed with Pt/Family: With  patient/family  Treatment/Interventions: Exercise;Gait training;Stair training;Neuromuscular re-education;Functional transfer training;LE strengthening/ROM;Endurance training;Patient/family training;Equipment eval/education;Bed mobility  PT Frequency: twice a day     Additional patient/coach education provided per Education Tab.    Recommendation  Discharge Recommendation: Home with supervision, Home with home health PT  PT - Next Visit Recommendation: 07/13/17  PT Frequency: twice a day    SUBJECTIVE   I am doing good. Patient is agreeable to participation in the therapy session.     Pain Assessment  Pain Assessment: Numeric Scale (0-10)  Pain Score: 6-moderate pain  Pain Location: Hip  Pain Orientation: Left  Pain Descriptors: Aching;Sharp  Pain Frequency: Continuous;Increases with movement  Patient's Stated Comfort Functional Goal: 4-moderate pain  Pain Intervention(s): Cold applied;Repositioned          OBJECTIVE   Patient is in bed  seen 1 Day Post-Op with dressings.    Cognition/Neuro Status  Arousal/Alertness: Appropriate responses to stimuli  Attention Span: Appears intact  Orientation Level: Oriented X4  Memory: Appears intact  Following Commands: Follows one step commands without difficulty  Safety Awareness: independent  Insights: Fully aware of deficits    Sensation is intact                                        Functional Mobility  Supine to Sit: Stand by  Assist  Scooting to [facility]: Stand by Assist  Scooting to EOB: Stand by Assist  Sit to Supine: Stand by Assist  Sit to Stand: Stand by Assist  Stand to Sit: Stand by Assist           Locomotion  Ambulation: Stand by Assist;with front-wheeled walker  Pattern: decreased step length;decreased cadence;Step to  Stair Management: with cane;one rail L  Number of Stairs: 8  PMP - Progressive Mobility Protocol   PMP Activity: Step 7 - Walks out of Room  Distance Walked (ft) (Step 6,7): 200 Feet       Therapeutic Exercise  Quad Sets: 25  Heelslides: 25  Glute  Sets: 25  Hip Flexion: 10AA  Hip Abduction: 25  Knee AROM Short Arc Quad: 25  Ankle Pumps: 25           Self Care and Home Management:  Rolling: patient performed supervision  Toilet Transfer technique: patient performed supervision  Tub/Shower Transfer technique: patient performed supervision  Car Transfer technique: patient performed supervision     Patient is in bed with call bell in reach and all needs provided.   Signature: Clearnce Sorrel, PTA 07/12/2017 3:02 PM

## 2017-07-12 NOTE — Plan of Care (Signed)
Problem: Safety  Goal: Patient will be free from injury during hospitalization  Outcome: Progressing   07/12/17 0117   Goal/Interventions addressed this shift   Patient will be free from injury during hospitalization  Assess patient's risk for falls and implement fall prevention plan of care per policy;Provide and maintain safe environment;Use appropriate transfer methods;Ensure appropriate safety devices are available at the bedside;Hourly rounding       Problem: Pain  Goal: Pain at adequate level as identified by patient  Outcome: Progressing   07/12/17 0117   Goal/Interventions addressed this shift   Pain at adequate level as identified by patient Identify patient comfort function goal;Assess for risk of opioid induced respiratory depression, including snoring/sleep apnea. Alert healthcare team of risk factors identified.;Assess pain on admission, during daily assessment and/or before any "as needed" intervention(s);Reassess pain within 30-60 minutes of any procedure/intervention, per Pain Assessment, Intervention, Reassessment (AIR) Cycle;Evaluate if patient comfort function goal is met;Evaluate patient's satisfaction with pain management progress;Include patient/patient care companion in decisions related to pain management as needed       Problem: Altered GI Function  Goal: Elimination patterns are normal or improving  Outcome: Progressing   07/12/17 0117   Goal/Interventions addressed this shift   Elimination patterns are normal or improving Report abnormal assessment to physician;Anticipate/assist with toileting needs;Assess for normal bowel sounds;Monitor for abdominal discomfort;Assess for signs and symptoms of bleeding. Report signs of bleeding to physician;Reinforce education on foods that improve and complicate bowel movements and how activity and medications can affect bowel movements;Encourage /perform oral hygiene as appropriate       Problem: Anxiety  Goal: Anxiety is at a manageable level  Outcome:  Progressing   07/12/17 0117   Goal/Interventions addressed this shift   Anxiety is at a manageable level Orient to unit;Inform/explain to patient/patient care companion all tests/procedures/treatment/care prior to initiation;Facilitate expression of feelings, fears, concerns, anxiety;Assess emotional status and coping mechanisms;Provide and maintain a safe environment;Provide alternatives to reduce anxiety;Include patient/patient care companion in decisions related to anxiety/depression;Provide emotional support

## 2017-07-12 NOTE — Plan of Care (Signed)
Pt using Pulse app prior to admission and will continue do so post op

## 2017-07-12 NOTE — Progress Notes (Signed)
Orthopaedic Surgery Progress Note    Date Time: 07/12/17 6:37 AM  Patient Name: Tracy [name],Tracy [name]      Assessment:    s/p left THA on 07/11/2017    Plan:   - continue PT - WBAT LLE,   - ASA, SCDs, and mobilization for DVT prophylaxis  - peri-operative antibiotics complete  - multimodal pain control  - plan for d/c home when cleared by PT    Subjective:   Pain controlled, tolerating diet    Medications:     Current Facility-Administered Medications   Medication Dose Route Frequency   . aspirin EC  81 mg Oral BID   . dexamethasone  2 mg Oral 4 times per day   . ezetimibe  10 mg Oral QHS   . ferrous sulfate  324 mg Oral Daily with dinner   . FLUoxetine  10 mg Oral Daily   . FLUoxetine  20 mg Oral Daily   . fluticasone  1 spray Each Nare Daily   . levothyroxine  88 mcg Oral Daily at 0600   . pantoprazole  40 mg Oral Daily before dinner   . pramipexole  0.75 mg Oral QHS   . senna-docusate  2 tablet Oral QHS   . vitamin C  500 mg Oral Daily with dinner       Physical Exam:     Vitals:    07/12/17 0353   BP: 118/58   Pulse: 84   Resp: 19   Temp: 98.7 F (37.1 C)   SpO2: 95%       Intake and Output Summary (Last 24 hours) at Date Time    Intake/Output Summary (Last 24 hours) at 07/12/17 1610  Last data filed at 07/12/17 0300   Gross per 24 hour   Intake             1050 ml   Output             1300 ml   Net             -250 ml       General appearance - no acute distress  Musculoskeletal - LLE   - dressing C/D/I  - fires TA / EHL / GSC  - SILT SP / DP / TN  - warm and well perfused distally    Labs:     Results     Procedure Component Value Units Date/Time    Basic metabolic panel [[id number]] Collected:  07/12/17 0633    Specimen:  Blood Updated:  07/12/17 0633    Narrative:       QAMLAB x 3    Hemoglobin and hematocrit, blood [[id number]] Collected:  07/12/17 0633    Specimen:  Blood Updated:  07/12/17 0633    Narrative:       Alla German x 3    RBC O.R. HOLD Blood Product, 1 Units [[id number]] Collected:  07/11/17 0734      Updated:  07/11/17 1804     RBC Leukoreduced RBC Leukoreduced     BLUNIT [id number]     Status released     PRODUCT CODE (NON READABLE) [id number]     Expiration Date [id number]     UTYPE A POS    Type and Screen [[id number]] Collected:  07/11/17 0734    Specimen:  Blood Updated:  07/11/17 0836     ABO Rh A POS     AB Screen Gel NEG          Physical  Therapy:   Pain Score: 0-No pain (sleeping) (07/12/17 0431)  Right Lower Extremity ROM:  [facility] DF/PF AROM) (07/11/17 1515)  Left Lower Extremity ROM:  [facility] DF/PF AROM) (07/11/17 1515)              Rads:   Radiological Procedure reviewed.      Signed by: Tollie Pizza Lasondra Hodgkins

## 2017-07-12 NOTE — UM Notes (Signed)
Si:  s/p left THA on 07/11/2017. Pain controlled, tolerating diet    IS: continue PT - WBAT LLE,   - ASA, SCDs, and mobilization for DVT prophylaxis  - peri-operative antibiotics complete  - multimodal pain control  - plan for d/c home when cleared by PT    CLEARED THERAPY AND DISCHARGED ON 07/12/2017    Tristan Schroeder RN,   Total [facility] Case Manager   [place]  [phone]  [phone] fax'

## 2017-07-12 NOTE — Discharge Summary -  Nursing (Signed)
Discussed discharge instructions with patient and sister; both verbalized understanding of teaching. Gave patient prescriptions, and discussed purpose/frequency/duration/side effects. Pt aware NOT to take aspirin (script was electronically sent to her pharmacy), she will use the venapro for 2 weeks for VTE prophylaxis. Patient is leaving in stable condition.  IV removed. Belongingspacked by the patient.

## 2017-07-12 NOTE — Progress Notes (Signed)
07/12/17 0827 07/12/17 0830 07/12/17 0832   Vital Signs   Heart Rate 77 94 80   Heart Rate Source Monitor Monitor Monitor   BP 110/56 120/62 111/56   BP Location Left arm Left arm Left arm   BP Method Automatic Automatic Automatic   Patient Position Lying Sitting Standing     asymptomatic

## 2017-07-12 NOTE — Progress Notes (Signed)
Chaplain Service      Background:  Visit Type: Initial was made by Chaplain with patient, Tracy [name], based on Source: Chaplain Initiated.  Present at Visit: patient, family members at bedside.  Spiritual Care Provided to: patient and family members.  Length of Visit: 0-15 minutes .    Summary:  Spiritual Care Interventions: Provided reflective and compassionate listening, Provided comfort, encouragement,affirmation, Provided prayer  Reason for Request: Other (Rounding)   Spiritual Care Outcomes: Patient expressed appreciation of visit, Family expressed appreciation of visit

## 2017-07-12 NOTE — Plan of Care (Signed)
Problem: Physical Therapy  Goal: By discharge, patient will perform mobility at the patient's highest functional potential. See PT evaluation/note for goals.  Outcome: Adequate for Discharge  Patient has cleared PT, adequate for next level of skilled PT services.   Clearnce Sorrel, PTA 07/12/2017 3:05 PM

## 2017-07-12 NOTE — PT Progress Note (Signed)
Physical Therapy Note    Physical Therapy Treatment    Patient:Tracy [name]   HKV#:[id number]   Unit:[facility] 4C  Bed:  M456/M456.01    PT Received On: 07/12/17 Start Time: 0917 Stop Time: 1015  Time Calculation (min): 58 min PT Visit Number: 2/7    Medical Diagnosis:  Primary localized osteoarthritis of left hip [M16.12]  Precautions  Weight Bearing Status: LLE WBAT  Total Hip Replacement: no extremes in ROM (Left Hip Replacement by Dr. Ahmed Prima on 06/06/17)  Precaution Instructions Given to Patient: Yes  MD orders for Exercises: active and passive    Patient's medical condition is appropriate for Physical Therapy  intervention at this time.  Patient cleared by nurse to participate in PT session, nursing reports no changes.    ASSESSMENT/PLAN   Patient is progressing in PT, would benefit from continued Pt with focus on meeting functional goals for a safe [place] home. RN/clin tech notified of this sessions outcome, including patient will be doing session this afternoon .  Continue plan of care.    Goals per Eval/Re-eval:   Goals  Goal Formulation: With patient/family  Time for Goal Acheivement: 7 visits  Goals: Select goal  Pt Will Go Supine To Sit: with supervision  Pt Will Perform Sit To Supine: with supervision  Pt Will Perform Sit to Stand: with supervision  Pt Will Transfer to Toilet: with stand by assist  Pt Will Transfer to Tub/Shower: with stand by assist  PT Will Demonstrate Car Transfer Technique: with minimal assist  Pt Will Ambulate: 151-200 feet, with supervision  Pt Will Go Up / Down Stairs: 6-10 stairs, with stand by assist  Pt Will Perform Home Exer Program: with stand by assist         Plan  Risks/Benefits/POC Discussed with Pt/Family: With patient/family  Treatment/Interventions: Exercise;Gait training;Stair training;Neuromuscular re-education;Functional transfer training;LE strengthening/ROM;Endurance training;Patient/family training;Equipment eval/education;Bed  mobility  PT Frequency: twice a day     Additional patient/coach education provided per Education Tab.    Recommendation  Discharge Recommendation: Home with supervision, Home with home health PT  PT - Next Visit Recommendation: 07/13/17  PT Frequency: twice a day    SUBJECTIVE   I am feeling fine. Patient is agreeable to participation in the therapy session.     Pain Assessment  Pain Assessment: Numeric Scale (0-10)  Pain Score: 7-severe pain  Pain Location: Hip  Pain Orientation: Left  Pain Descriptors: Aching;Sharp  Pain Frequency: Continuous;Increases with movement  Patient's Stated Comfort Functional Goal: 4-moderate pain  Pain Intervention(s): Cold applied;Repositioned          OBJECTIVE   Patient is in bed  seen 1 Day Post-Op with dressings.    Cognition/Neuro Status  Arousal/Alertness: Appropriate responses to stimuli  Attention Span: Appears intact  Memory: Appears intact  Following Commands: Follows one step commands without difficulty  Safety Awareness: minimal verbal instruction  Insights: Fully aware of deficits    Sensation is intact                                        Functional Mobility  Supine to Sit: Contact Guard Assist  Scooting to [facility]: Contact Guard Assist  Scooting to EOB: Contact Guard Assist  Sit to Supine: Minimal Assist  Sit to Stand: Risk analyst  Stand to Sit: Risk analyst  Locomotion  Ambulation: Contact Guard Assist;with front-wheeled walker  Pattern: decreased step length;decreased cadence;Step to  Stair Management: two rails;Stand by Assist;with gait belt  Number of Stairs: 8  PMP - Progressive Mobility Protocol   PMP Activity: Step 7 - Walks out of Room  Distance Walked (ft) (Step 6,7): 200 Feet       Therapeutic Exercise  Quad Sets: 25  Heelslides: 25  Glute Sets: 25  Hip Flexion: 10AA  Hip Abduction: 25  Knee AROM Short Arc Quad: 25  Ankle Pumps: 25           Self Care and Home Management:  Lower Body Dressing: patient performed supervision     Patient  is seated in a bedside chair with call bell in reach and all needs provided.   Signature: Clearnce Sorrel, PTA 07/12/2017 11:02 AM

## 2017-07-12 NOTE — Progress Notes (Signed)
[facility] INTERNAL MEDICINE PROGRESS NOTE    Date Time: 07/12/17 9:40 AM  Patient Name: Tracy [name],Tracy [name]        CC/Subjective:   Oneita Allmon Dominik is a 67 y.o. female w/ hypertension, hyperlipidemia, RLS, hypothyroid, anxiety, depression, hx SAH.   S/p L THA  07/11/2017  Pt seen in room w/ family, states she is "feeling good."    Medications:      Scheduled Meds: PRN Meds:        aspirin EC 81 mg Oral BID   dexamethasone 2 mg Oral 4 times per day   ezetimibe 10 mg Oral QHS   ferrous sulfate 324 mg Oral Daily with dinner   FLUoxetine 10 mg Oral Daily   FLUoxetine 20 mg Oral Daily   fluticasone 1 spray Each Nare Daily   levothyroxine 88 mcg Oral Daily at 0600   pantoprazole 40 mg Oral Daily before dinner   pramipexole 0.75 mg Oral QHS   senna-docusate 2 tablet Oral QHS   vitamin C 500 mg Oral Daily with dinner         Continuous Infusions:  . lactated ringers        diphenhydrAMINE 25 mg Q12H PRN   HYDROcodone-acetaminophen 1 tablet Q4H PRN   HYDROcodone-acetaminophen 1 tablet Q4H PRN   magnesium hydroxide 10 mL Daily PRN   naloxone 0.4 mg PRN   ondansetron 4 mg Q8H PRN   Or     ondansetron 4 mg Q8H PRN   promethazine 25 mg Q6H PRN   Or     promethazine 25 mg Q6H PRN   Or     promethazine 6.25 mg Q6H PRN   traMADol 50 mg Q6H PRN         I personally reviewed all of the medications      Review of Systems:   A comprehensive review of systems was obtained from chart review and the patient  General ROS: no unexplained weight loss/gain, no fevers, chills or rigors  Ophthalmic ROS: negative  Endocrine ROS: negative  Respiratory ROS: no cough, shortness of breath, or wheezing  Cardiovascular ROS: no chest pain or dyspnea on exertion  Gastrointestinal ROS: no abdominal pain or recent change in bowel habits, no nausea/vomiting  Genito-Urinary ROS: no c/o dysuria, trouble voiding, or hematuria  Musculoskeletal ROS: left hip pain- controlled with meds  Neurological ROS: no TIA or stroke symptoms, denies  dizziness  Dermatological ROS: negative for eczema, pruritis, and rash    Physical Exam:     Vitals:    07/12/17 0745 07/12/17 0827 07/12/17 0830 07/12/17 0832   BP: 113/59 110/56 120/62 111/56   Pulse: 82 77 94 80   Resp: 16      Temp: 99.4 F (37.4 C)      TempSrc: Oral      SpO2: 95%      Weight:       Height:             Intake and Output Summary (Last 24 hours) at Date Time    Intake/Output Summary (Last 24 hours) at 07/12/17 0940  Last data filed at 07/12/17 0858   Gross per 24 hour   Intake               50 ml   Output             1600 ml   Net            -1550 ml  General Appearance -  alert, well appearing, and in no distress  Mental Status  -awake, oriented to person, place, and time  Eyes - pupils equal and reactive, extraocular eye movements intact  CV - normal rate, regular rhythm, normal S1, S2, no murmurs,  peripheral pulses normal, no pedal edema, no clubbing or cyanosis  Resp  - clear to auscultation, no wheezes, rales or rhonchi, symmetric air entry  GI - bowel sounds normal, abdomen is soft, nontender, non distended  Neuro - alert, oriented, normal speech, no focal findings or movement disorder noted  Musculoskeletal - laying in bed, s/p therapy  Psych - appropriate affect   Integument - normal coloration and turgor, all visible areas of extremities checked, no rashes      Consultant note reviewed.    Labs:       Recent Labs  Lab 07/12/17  0633   Glucose 119*   BUN 9   Creatinine 0.8   Calcium 8.2*   Sodium 137   Potassium 4.5   Chloride 105   CO2 25   EGFR >60.0               Recent Labs  Lab 07/12/17  0633   Hgb 11.0*   Hematocrit 33.9*             Invalid input(s): FREET4        Rads:     Radiology Results (24 Hour)     Procedure Component Value Units Date/Time    XR Pelvis Portable [[id number]] Collected:  07/11/17 1028    Order Status:  Completed Updated:  07/11/17 1032    Narrative:       CLINICAL INDICATION: hardware evaluation.s/p THA left-sided    FINDINGS:      AP view of the pelvis  with AP view of the left femur. The  joint prothesis is in good position. There is no fracture or  dislocation. Postsurgical changes are seen in the soft tissues.       Impression:         Good position of the joint prothesis. No fracture or  dislocation.    Laurena Slimmer, MD   07/11/2017 10:28 AM    XR femur left 1 view [[id number]] Collected:  07/11/17 1028    Order Status:  Completed Updated:  07/11/17 1032    Narrative:       CLINICAL INDICATION: hardware evaluation.s/p THA left-sided    FINDINGS:      AP view of the pelvis with AP view of the left femur. The  joint prothesis is in good position. There is no fracture or  dislocation. Postsurgical changes are seen in the soft tissues.       Impression:         Good position of the joint prothesis. No fracture or  dislocation.    Laurena Slimmer, MD   07/11/2017 10:28 AM                  Medical History     Past Medical History:   Diagnosis Date   . Anxiety    . Arthritis    . Asthma    . Bilateral cataracts     early stages   . Depression     on prozac many yrs stable sees therapist   . Difficulty walking    . Diverticulitis    . Dysphagia    . Dyspnea on exertion    . Ear, nose and throat  disorder     seasonal allergies   . Eczema    . Gastroesophageal reflux disease    . Hard to intubate    . Health care maintenance     colonscopy 1/08-5 yr f/u dr Durwin Nora  pap-1/12  mammo-, dexa-1/12,mammo 3/13  tetanius shot 3/12, with MMR and hep A/B  had pneumovax age 20  NL CXR 05/30/11   . History of colonoscopy 2007    MGM colon cancer 70  nl colonscopy 2007 dr Durwin Nora   . Hyperlipidemia     sees cards dr. Neomia Dear  9/12 chol 274 trigs 225 H53/L176 had myositis with statins,niacin-now on zetia had nl carotid duplex/thallium 1/12 Nl thallium-6/13  11/13 chol 260 H63/L173,ck= 305,cmp-nl creat 0.95-Referred to rheum 5/`14 re elevated cpk Saw Dr Neomia Dear 9/14-myalgia gone ,off tricor,but ck still even higher-echo/thallium ordred   . Hypertension    . Hypothyroidism    . Osteoporosis      o'penia had been on reclast per gyn and ergocalciferol early 2012. dexa 1/12-T scores -1.1, -0.5   . Restless legs syndrome    . Routine history and physical examination of adult 02/2010    last Tetanus shot 2012   . SAH (subarachnoid hemorrhage)    . Syncope and collapse    . Thyroid disease     hypothyroid on synthroid   . Urinary tract infection               Assessment and Plan:        Patient Active Problem List   Diagnosis    Hip arthritis, s/p  L THA - continue plan, DVT prophylaxis, pain management, and physical therapy as per Ortho   . Thyroid disease/hypothyroidism -continue levothyroxine   . Depression/Anxiety -continue medication   . Hyperlipidemia-continue Zetia   . SDH (subdural hematoma) hx -cleared by neurology for surgery. No ASA for DVT prophylaxis as per neurology.   .  Asthma -no medications on preop list.  Currently stable.   . Hypertension - blood pressure stable, no medications preoperatively.         Signed by: Sheryle Hail, NP

## 2017-07-12 NOTE — Plan of Care (Signed)
Problem: Hip Surgery  Goal: Patient/Patient Care Companion demonstrates understanding of disease process, treatment plan, medications, and discharge plan  Outcome: Adequate for Discharge   07/12/17 1501   Goal/Interventions addressed this shift   Patient/patient care companion demonstrates understanding of disease process, treatment plan, medications, and discharge plan  Provide patient/patient care companion with Joint packet if indicated;Initiate/review discharge plans with patient/patient care companion;Teach/review/reinforce hip precautions with patient/patient care companion;Identify special equipment needs for discharge;Orient to unit;Consult/collaborate with Case Management/Social Work;Provide education on patient medications, medication side effects, dressing changes, activity level, brace instructions and care to patient/patient care companion;Provide anticoagulation teaching if indicated;Provide discharge instructions, medication reconciliation, and prescriptions to patient/patient care companion     Goal: Free from Infection  Outcome: Adequate for Discharge   07/12/17 1501   Goal/Interventions addressed this shift   Free from infection Maintain temperature within desired parameters;Monitor/assess vital signs;Assess for signs and symptoms of infection;Assess surgical dressing, reinforce or change as needed per order;Teach/reinforce use of incentive spirometer 10 times per hour while awake, cough and deep breath as needed     Goal: Nutritional Intake is Adequate  Outcome: Completed Date Met: 07/12/17   07/12/17 1501   Goal/Interventions addressed this shift   Nutritional intake is adequate Assess GI status (bowel sounds, nausea/vomiting, distention, flatus);Administer anti-emetic if needed as prescribed;Advance diet as ordered and tolerated;Administer stool softener as prescribed     Goal: Neurovascular Status is Stable  Outcome: Adequate for Discharge   07/12/17 1501   Goal/Interventions addressed this  shift   Neurovascular status is stable  Assess and document plantar/dorsiflexion;Monitor/assess neurovascular status (pulses, capillary refill, pain, paresthesia, presence of edema);VTE prevention: administer anticoagulant(s) and/or apply anti-embolism stockings/devices as ordered     Goal: Hemodynamic Stability  Outcome: Adequate for Discharge   07/12/17 1501   Goal/Interventions addressed this shift   Hemodynamic stability  Monitor/assess vital signs;Maintain temperature within desired parameters;Monitor SpO2 and treat as needed;Monitor/assess lab values and report abnormal values     Goal: Mobility/activity is maintained at optimum level for patient  Outcome: Adequate for Discharge   07/12/17 1501   Goal/Interventions addressed this shift   Mobility/activity is maintained at optimal level for patient Evaluate if patient comfort function goal is met;Utilize special equipment (trapeze, abduction pillow, regular pillow, walker) as needed and as ordered;Dangle with assistance if indicated;Out of bed to chair with assistance;PT and/or OT evaluation and treatment if ordered;Teach/review/reinforce weight bearing status with patient/patient care companion;Teach/review/reinforce hip precautions with patient/patient care companion;Teach/review/reinforce exercises (ankle pumps, quad sets, gluteal sets);Participate in PT and/or OT plan of care (bed mobility, transfer/gait training, ADL, curb stair training);Ambulate greater than or equal to 80 feet with assistance;Ambulate more independently       Problem: Altered GI Function  Goal: Nutritional intake is adequate  Outcome: Adequate for Discharge   07/12/17 1501   Goal/Interventions addressed this shift   Nutritional intake is adequate Assist patient with meals/food selection;Allow adequate time for meals;Encourage/perform oral hygiene as appropriate       Problem: Nutrition  Goal: Nutritional intake is adequate  Outcome: Adequate for Discharge   07/12/17 1501    Goal/Interventions addressed this shift   Nutritional intake is adequate Assist patient with meals/food selection;Allow adequate time for meals;Encourage/perform oral hygiene as appropriate       Problem: Inadequate Airway Clearance  Goal: Normal respiratory rate/effort achieved/maintained  Outcome: Completed Date Met: 07/12/17   07/12/17 1501   Goal/Interventions addressed this shift   Normal respiratory rate/effort achieved/maintained Plan activities to conserve energy: plan  rest periods       Problem: Anxiety  Goal: Anxiety is at a manageable level  Outcome: Adequate for Discharge   07/12/17 1501   Goal/Interventions addressed this shift   Anxiety is at a manageable level Orient to unit;Inform/explain to patient/patient care companion all tests/procedures/treatment/care prior to initiation;Facilitate expression of feelings, fears, concerns, anxiety

## 2017-07-17 NOTE — Plan of Care (Signed)
Follow-up phone call to pt on....07/17/17 @ 1406  How are you doing today?Marland KitchenMarland KitchenMarland KitchenDoing pretty good. Just finished PT session with Home Care PT  Any questions about [place] instructions?Marland KitchenMarland KitchenMarland KitchenPt stated that she misunderstood the instructions with how often to take pain medications. Was only taking one dose daily at night. Pt called Dr. Earlean Shawl office this morning and got clarification that she may take Norco every 4 hours as needed.  Has the Home Care contacted you/started?Marland Kitchen..YES  Did the education before surgery, provided by the hospital, meet your needs? Response:       Yes   How could we have better met your educational needs? NA  Anything else I can do for you?Marland Kitchen..NO  Follow-up actions...NA      Vernie Murders, RN, BSN, MSN  [facility] Coordinator  [phone]

## 2018-01-24 ENCOUNTER — Emergency Department: Payer: BLUE CROSS/BLUE SHIELD

## 2018-01-24 ENCOUNTER — Emergency Department
Admission: EM | Admit: 2018-01-24 | Discharge: 2018-01-24 | Disposition: A | Discharge status: Home / Self Care | Payer: BLUE CROSS/BLUE SHIELD | Attending: Emergency Medicine | Admitting: Emergency Medicine

## 2018-01-24 DIAGNOSIS — M25552 Pain in left hip: Secondary | ICD-10-CM | POA: Insufficient documentation

## 2018-01-24 DIAGNOSIS — Y92410 Unspecified street and highway as the place of occurrence of the external cause: Secondary | ICD-10-CM | POA: Insufficient documentation

## 2018-01-24 DIAGNOSIS — Z96642 Presence of left artificial hip joint: Secondary | ICD-10-CM | POA: Insufficient documentation

## 2018-01-24 DIAGNOSIS — S0990XA Unspecified injury of head, initial encounter: Secondary | ICD-10-CM | POA: Insufficient documentation

## 2018-01-24 MED ORDER — ACETAMINOPHEN 500 MG PO TABS
1000.00 mg | ORAL_TABLET | Freq: Once | ORAL | Status: AC
Start: 2018-01-24 — End: 2018-01-24
  Administered 2018-01-24: 19:00:00 1000 mg via ORAL
  Filled 2018-01-24: qty 2

## 2018-01-24 MED ORDER — LIDOCAINE 5 % EX PTCH
1.00 | MEDICATED_PATCH | CUTANEOUS | 0 refills | Status: AC
Start: 2018-01-24 — End: ?

## 2018-01-24 MED ORDER — LIDOCAINE 5 % EX PTCH
1.00 | MEDICATED_PATCH | CUTANEOUS | Status: DC
Start: 2018-01-24 — End: 2018-01-24
  Administered 2018-01-24: 19:00:00 1 via TRANSDERMAL
  Filled 2018-01-24: qty 1

## 2018-01-24 MED ORDER — OXYCODONE HCL 5 MG PO TABS
5.0000 mg | ORAL_TABLET | Freq: Once | ORAL | Status: AC
Start: 2018-01-24 — End: 2018-01-24
  Administered 2018-01-24: 19:00:00 5 mg via ORAL
  Filled 2018-01-24: qty 1

## 2018-01-24 NOTE — ED Notes (Signed)
Bed: CRA  Expected date:   Expected time:   Means of arrival:   Comments:  Medic 301- left hip pain
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
## 2018-01-24 NOTE — Discharge Instructions (Signed)
Thank you for choosing [facility] for your emergency care needs.   We strive to provide EXCELLENT care to you and your family.      If you do not continue to improve or your condition worsens, please contact your doctor or return immediately to the Emergency Department.    DOCTOR REFERRALS  Call [phone] if you need any further referrals and we can help you find a primary care doctor or specialist.  Also, available online at:  [email or web address]      FREE HEALTH SERVICES  If you need help with health or social services, please call 2-1-1 for a free referral to resources in your area.  2-1-1 is a free service connecting people with information on health insurance, free clinics, pregnancy, mental health, dental care, food assistance, housing, and substance abuse counseling.  Also, available online at:  [email or web address].org    MEDICAL RECORDS AND TESTS  Certain laboratory test results do not come back the same day, for example urine cultures.   We will contact you if other important findings are noted.  Radiology films are often reviewed again to ensure accuracy.  If there is any discrepancy, we will notify you.      ORTHOPEDIC INJURY   Please know that significant injuries can exist even when an initial x-ray is read as normal or negative.  This can occur because some fractures (broken bones) are not initially visible on x-rays.  For this reason, close outpatient follow-up with your primary care doctor or bone specialist (orthopedist) is required.    MEDICATIONS AND FOLLOWUP  Please be aware that some prescription medications can cause drowsiness.  Use caution when driving or operating machinery.    BLOOD PRESSURE  If at any time during your visit, the measured blood pressure was greater than 120 systolic (top number) or 80 diastolic (bottom number), please see you primary care physician in 1-2 days.     The examination and treatment you have received in our Emergency  Department is provided on an emergency basis, and is not intended to be a substitute for your primary care physician.  It is important that your doctor checks you again and that you report any new or remaining problems at that time.        Hip pain    You have hip pain.    There are many different causes of hip pain, such as:   The hip bones are not shaped normally. This is called a structural abnormality.   Infection.   Tumor.   Constant injury to the hip area.   New injury.   Stress fractures caused from putting repeated pressure on the hips while playing sports.   Inflammation.   Sprains.   Inflammation of the tendons (tendonitis).    Sudden injury causing bruises, sprains, strains or broken bones.     More serious causes of hip pain may have been ruled out today with a hip x-ray. These can be broken bones, structural or growth plate abnormalities, or a tumor. Obesity can also cause excess wear and tear on your hips.    Symptoms of hip pain include:   Pain.   Swelling   Bruising or other changes to the skin.   Trouble moving or walking.     If the pain is from an injury, it may take a few weeks to get better.     Care for hip pain includes: Some things you can do at home are:   Rest.  Frequent stretching.   Massage.   Taking pain medication like ibuprofen (Advil or Motrin).   Acetaminophen (Tylenol).   Use a cane, crutches or walker.   Ice or heating pad    Your doctor decided no other treatment was needed today. You may follow-up with your primary care doctor, or you may have been given a referral to a specialist. This could be an orthopedic surgeon (bone doctor), a sports medicine specialist or a neurosurgeon (brain and spine doctor).    SEEK MEDICAL ATTENTION IMMEDIATELY, EITHER HERE OR AT THE NEAREST EMERGENCY DEPARTMENT, IF ANY OF THE FOLLOWING HAPPENS:     There is a serious increase in pain in the affected area.   You get new numbness and tingling in or under the  affected area.   Your hip gets red or swollen.   Your symptoms haven't started to get better in 1-2 weeks.   You get a temperature greater than 100.56F (38C).    If you can't follow up with your doctor, or if you feel you need to be rechecked or seen again, come back here or go to the nearest emergency department.
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
## 2019-01-19 ENCOUNTER — Emergency Department
Admission: EM | Admit: 2019-01-19 | Discharge: 2019-01-19 | Disposition: A | Discharge status: Home / Self Care | Payer: Medicare HMO | Attending: Emergency Medicine | Admitting: Emergency Medicine

## 2019-01-19 DIAGNOSIS — W010XXA Fall on same level from slipping, tripping and stumbling without subsequent striking against object, initial encounter: Secondary | ICD-10-CM | POA: Insufficient documentation

## 2019-01-19 DIAGNOSIS — E039 Hypothyroidism, unspecified: Secondary | ICD-10-CM | POA: Insufficient documentation

## 2019-01-19 DIAGNOSIS — E785 Hyperlipidemia, unspecified: Secondary | ICD-10-CM | POA: Insufficient documentation

## 2019-01-19 DIAGNOSIS — I10 Essential (primary) hypertension: Secondary | ICD-10-CM | POA: Insufficient documentation

## 2019-01-19 DIAGNOSIS — S81811A Laceration without foreign body, right lower leg, initial encounter: Secondary | ICD-10-CM | POA: Insufficient documentation

## 2019-01-19 MED ORDER — CEPHALEXIN 500 MG PO CAPS: 500.0000 mg | ORAL_CAPSULE | Freq: Two times a day (BID) | ORAL | 0 refills | Status: AC

## 2019-01-19 MED ORDER — LIDOCAINE-EPINEPHRINE 1 %-1:100000 IJ SOLN
20.00 mL | Freq: Once | INTRAMUSCULAR | Status: AC
Start: 2019-01-19 — End: 2019-01-19
  Administered 2019-01-19: 12:00:00 20 mL
  Filled 2019-01-19: qty 20

## 2019-01-19 NOTE — Discharge Instructions (Signed)
Laceration, Sutures    You have been treated for a laceration (cut).    Follow up with your doctor OR come back here OR go to the nearest Emergency Department to have your sutures (stitches) taken out. Sutures should be taken out in:   14 days.    Use the following wound care instructions:   Keep the wound clean and dry for the next 24 hours. You can wash the wound gently with soap and water.   DO NOT allow your wound to soak in water (dont do the dishes or go swimming, for example). You can shower, but do not rub your stitches too hard. Let the wound dry before putting another bandage on.   Take off old dressings every day. Then put on a clean, dry dressing.   If the bandage sticks to the wound, slightly moisten it with water. This way, it can come off more easily.   You can wash the wound gently with soap and water. To help remove a scab, cleanse the area with a mixture of half hydrogen peroxide and half water. This will also help [place] to take out the sutures later.   Allow the area to dry completely before putting on a new bandage.   Unless you receive instructions not to do so, you can place a thin layer of antibiotic ointment over the wound. You can buy Polysporin, Bacitracin, or Neosporin at the store. Neosporin can sometimes cause irritation to your skin. If this happens, stop using it and switch to another topical (surface) antibiotic.   If needed, put a clean, dry bandage over the wound to protect it.    Keep the affected area elevated (lifted) for the next 24 hours. This will decrease swelling and pain. You may also want to put ice on the area. By applying ice to the affected area, swelling and pain can be reduced. Place some ice cubes in a re-sealable (Ziploc) bag and add some water. Put a thin washcloth between the bag and the skin. Apply the ice bag to the area for at least 20 minutes. Do this at least 4 times per day. It is OK to use the ice more frequently and for longer periods of  time. DO NOT APPLY ICE DIRECTLY TO THE SKIN!    If you had a local anesthetic, it will wear off in about 2 hours. Until then, be careful not to hurt yourself because of having less feeling in the area.    YOU SHOULD SEEK MEDICAL ATTENTION IMMEDIATELY, EITHER HERE OR AT THE NEAREST EMERGENCY DEPARTMENT, IF ANY OF THE FOLLOWING OCCURS:   You see redness or swelling.   There are red streaks going up from the injured area.   The wound smells bad or has a lot of drainage.   You have fever (temperature higher than 100.90F / 38C), chills, worse pain and / or swelling.        Send a picture on Wed 01/29/2019 to [name]. I work Wed overnight and if it looks good, I may have you come in then to get sutures taken out. If not, Tracy [name] can see you on 12/12 or 12/14.

## 2019-01-19 NOTE — ED Triage Notes (Signed)
Patient states that she has plastic lattice on the gate in her yard. Pt says that she was stepping around it and cut her right lower leg on the lattice. Pt arrived with pressure dressing to right leg. Pt denies any other injury, concern, or complaint at this time. Pt says that her tdap is up to date as of 10/2018.

## 2019-01-19 NOTE — ED Provider Notes (Signed)
EMERGENCY DEPARTMENT HISTORY AND PHYSICAL EXAM     None        Date: 01/19/2019  Patient Name: Tracy [name]  Attending Physician: Tracy Shivers, MD  Advanced Practice Provider: Arnoldo Morale, PA-C    History of Presenting Illness       History Provided By: Patient  Interpreter: None required     Chief Complaint:   Laceration (right leg)      HPI: Tracy [name] is a 68 y.o. female presenting to the ED with a laceration of the R lower leg that occurred <1 hour ago. Mechanism of injury was tripped over low plastic lattice dog gate. Patient placed ace bandage+tape tourniquet proximal to wound in addition to pressure dressing. Ambulatory but reports diffuse numbness/tingling to extremity. Patient denies head injury and loss of consciousness. Relevant history affecting wound healing: none. Patient's Tetanus vaccine up to date (within 10 years).     There were no other injuries.    PCP: Tracy Askew, MD  SPECIALISTS:    No current facility-administered medications for this encounter.     Current Outpatient Medications:     cephalexin (KEFLEX) 500 MG capsule, Take 1 capsule (500 mg total) by mouth 2 (two) times daily for 5 days Take 1 capsule by mouth 2 (two) times daily for 5 days., Disp: 10 capsule, Rfl: 0    Cholecalciferol (VITAMIN D) 1000 UNITS capsule, Take 1,000 Units by mouth every morning   , Disp: , Rfl:     co-enzyme Q-10 30 MG capsule, Take 30 mg by mouth daily as needed   , Disp: , Rfl:     desoximetasone (TOPICORT) 0.05 % cream, Apply topically as needed   , Disp: , Rfl:     Evolocumab (REPATHA SURECLICK) 140 MG/ML Solution Auto-injector, 1 mL Every 2 weeks, Disp: , Rfl:     ezetimibe (ZETIA) 10 MG tablet, Take 10 mg by mouth every evening   , Disp: , Rfl:     FLUoxetine (PROZAC) 10 MG tablet, Take 10 mg by mouth every morning   , Disp: , Rfl:     FLUoxetine (PROZAC) 20 MG capsule, Take 20 mg by mouth every morning, Disp: , Rfl:     fluticasone (FLONASE) 50 MCG/ACT nasal  spray, INSTILL SPRAY IN NOSTRIL TWICE A DAY, Disp: , Rfl: 3    gabapentin (NEURONTIN) 300 MG capsule, TAKE 1 TABLET AT NIGHT, Disp: , Rfl: 1    glucosamine-chondroitin 500-400 MG tablet, Take 1 tablet by mouth as needed   , Disp: , Rfl:     HYDROcodone-acetaminophen (NORCO) 5-325 MG per tablet, Take 1-2 tablets by mouth every 4 (four) hours as needed for Pain, Disp: 45 tablet, Rfl: 0    levothyroxine (SYNTHROID, LEVOTHROID) 88 MCG tablet, Take 88 mcg by mouth every morning   , Disp: , Rfl:     lidocaine (LIDODERM) 5 %, Place 1 patch onto the skin every 24 hours Remove & Discard patch within 12 hours or as directed by MD, Disp: 30 each, Rfl: 0    Multiple Vitamins-Minerals (MULTIVITAMIN WITH MINERALS) tablet, Take 1 tablet by mouth every morning   , Disp: , Rfl:     Omega-3 Fatty Acids (OMEGA-3 FISH OIL) 500 MG Cap, Take by mouth daily as needed   , Disp: , Rfl:     ondansetron (ZOFRAN) 4 MG tablet, Take 1 tablet (4 mg total) by mouth every 12 (twelve) hours as needed for Nausea, Disp: 14 tablet, Rfl:  0    pantoprazole (PROTONIX) 40 MG tablet, Take 1 tablet (40 mg total) by mouth every morning before breakfast., Disp: 30 tablet, Rfl: 0    pramipexole (MIRAPEX) 0.25 MG tablet, Take 0.5 mg by mouth nightly   , Disp: , Rfl:     traMADol (ULTRAM) 50 MG tablet, Take 1 tablet (50 mg total) by mouth every 6 (six) hours as needed for Pain, Disp: 60 tablet, Rfl: 0    TURMERIC PO, Take by mouth every morning   , Disp: , Rfl:     VITAMIN E PO, Take by mouth every morning   , Disp: , Rfl:     White Petrolatum-Mineral Oil (SYSTANE NIGHTTIME) Ointment, every evening, Disp: , Rfl: 99    Past History     Past Medical History:  Past Medical History:   Diagnosis Date    Anxiety     Arthritis     Asthma     Bilateral cataracts     early stages    Depression     on prozac many yrs stable sees therapist    Difficulty walking     Diverticulitis     Dysphagia     Dyspnea on exertion     Ear, nose and throat disorder      seasonal allergies    Eczema     Gastroesophageal reflux disease     Hard to intubate     Health care maintenance     colonscopy 1/08-5 yr f/u dr Tracy [name]  pap-1/12  mammo-, dexa-1/12,mammo 3/13  tetanius shot 3/12, with MMR and hep A/B  had pneumovax age 31  NL CXR 05/30/11    History of colonoscopy 2007    MGM colon cancer 70  nl colonscopy 2007 dr Tracy [name]    Hyperlipidemia     sees cards dr. Neomia [name]  9/12 chol 274 trigs 225 H53/L176 had myositis with statins,niacin-now on zetia had nl carotid duplex/thallium 1/12 Nl thallium-6/13  11/13 chol 260 H63/L173,ck= 305,cmp-nl creat 0.95-Referred to rheum 5/`14 re elevated cpk Saw Dr Tracy [name] 9/14-myalgia gone ,off tricor,but ck still even higher-echo/thallium ordred    Hypertension     Hypothyroidism     Osteoporosis     o'penia had been on reclast per gyn and ergocalciferol early 2012. dexa 1/12-T scores -1.1, -0.5    Restless legs syndrome     Routine history and physical examination of adult 02/2010    last Tetanus shot 2012    SAH (subarachnoid hemorrhage)     Syncope and collapse     Thyroid disease     hypothyroid on synthroid    Urinary tract infection        Past Surgical History:  Past Surgical History:   Procedure Laterality Date    ARTHROPLASTY, HIP TOTAL Left 07/11/2017    Procedure: ARTHROPLASTY, HIP TOTAL;  Surgeon: Tracy Hacker, MD;  Location: [facility];  Service: Orthopedics;  Laterality: Left;    COLONOSCOPY      EGD N/A 08/28/2016    Procedure: EGD;  Surgeon: Tracy Mount, MD;  Location: [facility];  Service: Gastroenterology;  Laterality: N/A;    EYE SURGERY  2011    HYSTERECTOMY      complete laparoscopic hyterectomy,rectocele/cystocele repair 09/12/11 in [place]    maxillofacial  1981    NASAL SEPTUM SURGERY         Family History:  Family History   Problem Relation Age of Onset    Osteoporosis Mother  COPD Mother     Diabetes Mother     Arthritis Mother     Cancer Father     Cancer Paternal Grandfather         Social History:  Social History     Tobacco Use    Smoking status: Never Smoker    Smokeless tobacco: Never Used   Substance Use Topics    Alcohol use: Yes     Comment: glass of wine once/month    Drug use: No       Allergies:  Allergies   Allergen Reactions    Fenofibrate Swelling    Niacin Swelling    Onion Other (See Comments)     Gi distress    Peppers Other (See Comments)     GI distress    Statins      Muscle pain and liver enzymes elevated    Sulfa Antibiotics Hives    Sulfa Drugs Cross Reactors Hives    Colesevelam Rash    Erythromycin Rash    Neosporin [Neomycin-Bacitracin Zn-Polymyx] Rash    Penicillins Rash     Pt states she is NOT allergic to Penicillins. She said she has taken amoxicillin recently without issues; there is a also a dispensing record for Augmentin from 11/2016.        Review of Systems     Review of Systems   Constitutional: Negative for chills, diaphoresis and fever.   Eyes: Negative for blurred vision.   Gastrointestinal: Negative for nausea.   Skin:        laceration   Neurological: Negative for dizziness, tingling, sensory change, focal weakness and weakness.     Physical Exam     Vitals:    01/19/19 1048 01/19/19 1245   BP: 140/66 160/83   Pulse: 82 72   Resp: 16 14   Temp: 97.8 F (36.6 C)    TempSrc: Oral    SpO2: 98% 98%   Weight: 77.1 kg    Height: 5\' 6"  (1.676 m)      Pulse Oximetry Analysis - Normal SpO2: 98 % on RA    Physical Exam  Vitals signs and nursing note reviewed.   Constitutional:       General: She is not in acute distress.     Appearance: Normal appearance. She is well-developed.   HENT:      Head: Normocephalic.      Right Ear: External ear normal.      Left Ear: External ear normal.   Neck:      Musculoskeletal: Normal range of motion and neck supple.   Cardiovascular:      Rate and Rhythm: Normal rate.   Pulmonary:      Effort: Pulmonary effort is normal.   Abdominal:      General: There is no distension.   Musculoskeletal: Normal range of motion.    Skin:     General: Skin is warm and dry.      Comments: Full thickness anterior RLE laceration approximately 17 cm, fascia exposed, mostly intact. Diminished distal sensation and cap refill 2/2 tourniquet placed by patient, improved with removal. Plantar/dorsiflexion 5/5.    Neurological:      Mental Status: She is alert and oriented to person, place, and time.                   Diagnostic Study Results     Labs -  Lab Results    None  Radiologic Studies -   Radiology Results (24 Hour)     ** No results found for the last 24 hours. **      .      Medical Decision Making     I reviewed the vital signs, available nursing notes, past medical history, past surgical history, family history and social history.    Prior Records: Nursing notes.     Procedures:   ------------------- PROCEDURE: COMPLEX LACERATION REPAIR  --------------------    Performed by the emergency provider  Consent:  Informed consent, after discussion of the risks, benefits, and alternatives to the procedure, was obtained  Location:  RLE  Length: 15 cm  Complexity: Complex   Description: deep flap, see image  Distal CMS:  Normal.  No deficits.  Neurovascularly intact.  Anesthesia: Lidocaine 1% with epi  Preparation: The wound was cleaned with betadine and irrigated copiously with sterile saline.  The area was prepped and draped in the usual sterile fashion.   Exploration:   The wound was explored and no foreign bodies were found.  Procedure: The skin was closed with 4.0 nylon.  There was good approximation.  In total, 15 simple interrupted were used.  The sub-cutaneous was closed with 3.  There was good approximation.  In total, 4.0 vicryl were used.  The deep layer was closed with 4.0 vicryl.  There was appropriate approximation.  In total, 2 were used.  Post-Procedure: Good closure and hemostasis.  The patient tolerated the procedure well and there were no complications.  CSM remains intact.  Post procedure dressing applied by RN.                   ED Medications  Medications   lidocaine-EPINEPHrine (XYLOCAINE W/EPI) 1 %-1:100000 injection 20 mL (20 mLs Other Given 01/19/19 1134)       ED Course:       Provider Notes:   Laceration to the RLE. NVI. Wound fully explored. X-Ray was not performed. No bony injury, wound fully visualized. Repaired with two layer closure. Removal required in 10 days. Tetanus booster was not indicated at this time. Antibiotics prescribed for prophylaxis.    The possibility of tendon laceration and nerve injury was explained to patient and they voiced understanding to return to ED for worsening condition.  We will refer to the plastics PRN.      Wound care instructions were given to patient including but not limited to: return for redness, swelling, purulent discharge, continued or worsening numbness or tinging.  These instructions were understood and the patient promised to return for worsening condition.      Diagnosis     Clinical Impression:   1. Leg laceration, right, initial encounter        Treatment Plan:   ED Disposition     ED Disposition Condition Date/Time Comment    Discharge  Sun Jan 19, 2019 12:44 PM Tracy Dodrill Fedak discharge to home/self care.    Condition at disposition: Stable            _______________________________    CHART OWNERSHIP: I, Petar Mucci Valora Corporal, PA-C, am the primary clinician of record.  _______________________________       Tracy Morale, PA  01/21/19 0725       Tracy Shivers, MD  01/22/19 [address] [place], [place]  02/13/19 2245       Tracy Shivers, MD  02/13/19 2250

## 2019-01-29 ENCOUNTER — Emergency Department
Admission: EM | Admit: 2019-01-29 | Discharge: 2019-01-29 | Disposition: A | Discharge status: Home / Self Care | Payer: Medicare HMO | Attending: Emergency Medicine | Admitting: Emergency Medicine

## 2019-01-29 DIAGNOSIS — Z4802 Encounter for removal of sutures: Secondary | ICD-10-CM | POA: Insufficient documentation

## 2019-01-29 DIAGNOSIS — I10 Essential (primary) hypertension: Secondary | ICD-10-CM | POA: Insufficient documentation

## 2019-01-29 DIAGNOSIS — E785 Hyperlipidemia, unspecified: Secondary | ICD-10-CM | POA: Insufficient documentation

## 2019-01-29 DIAGNOSIS — T792XXA Traumatic secondary and recurrent hemorrhage and seroma, initial encounter: Secondary | ICD-10-CM | POA: Insufficient documentation

## 2019-01-29 DIAGNOSIS — E039 Hypothyroidism, unspecified: Secondary | ICD-10-CM | POA: Insufficient documentation

## 2019-01-29 MED ORDER — LIDOCAINE-EPINEPHRINE 1 %-1:100000 IJ SOLN
20.00 mL | Freq: Once | INTRAMUSCULAR | Status: AC
Start: 2019-01-29 — End: 2019-01-29
  Administered 2019-01-29: 23:00:00 20 mL
  Filled 2019-01-29: qty 20

## 2019-01-29 NOTE — ED Provider Notes (Signed)
EMERGENCY DEPARTMENT HISTORY AND PHYSICAL EXAM     None        Date: (Not on file)  Patient Name: Tracy [name] Speros  Attending Physician: Everlean Cherry, MD  Advanced Practice Provider: Arnoldo Morale, PA-C    History of Presenting Illness       History Provided By: Patient  Interpreter: None    Chief Complaint:  Chief Complaint   Patient presents with    Suture / Staple Removal       HPI: [name] is a 68 y.o. female presenting to the ED for suture removal of wound to R lower leg sutured by me 11/29 with a double layer closure. Pt reports small amount of yellow drainage yesterday with increasing pain over last few days. Appropriate cleaning and bandaging, finished abx regimen. Pain improves with elevation of extremity. Denies fever, chills, myalgias/arthralgias.     PCP: Doyle Askew, MD  SPECIALISTS:    Current Facility-Administered Medications   Medication Dose Route Frequency Provider Last Rate Last Admin    lidocaine-EPINEPHrine (XYLOCAINE W/EPI) 1 %-1:100000 injection 20 mL  20 mL Other Once Samo-Lipman, Ragen Laver R, PA         Current Outpatient Medications   Medication Sig Dispense Refill    Cholecalciferol (VITAMIN D) 1000 UNITS capsule Take 1,000 Units by mouth every morning          co-enzyme Q-10 30 MG capsule Take 30 mg by mouth daily as needed          desoximetasone (TOPICORT) 0.05 % cream Apply topically as needed          Evolocumab (REPATHA SURECLICK) 140 MG/ML Solution Auto-injector 1 mL Every 2 weeks      ezetimibe (ZETIA) 10 MG tablet Take 10 mg by mouth every evening          FLUoxetine (PROZAC) 10 MG tablet Take 10 mg by mouth every morning          FLUoxetine (PROZAC) 20 MG capsule Take 20 mg by mouth every morning      fluticasone (FLONASE) 50 MCG/ACT nasal spray INSTILL SPRAY IN NOSTRIL TWICE A DAY  3    gabapentin (NEURONTIN) 300 MG capsule TAKE 1 TABLET AT NIGHT  1    glucosamine-chondroitin 500-400 MG tablet Take 1 tablet by mouth as needed           HYDROcodone-acetaminophen (NORCO) 5-325 MG per tablet Take 1-2 tablets by mouth every 4 (four) hours as needed for Pain 45 tablet 0    levothyroxine (SYNTHROID, LEVOTHROID) 88 MCG tablet Take 88 mcg by mouth every morning          lidocaine (LIDODERM) 5 % Place 1 patch onto the skin every 24 hours Remove & Discard patch within 12 hours or as directed by MD 30 each 0    Multiple Vitamins-Minerals (MULTIVITAMIN WITH MINERALS) tablet Take 1 tablet by mouth every morning          Omega-3 Fatty Acids (OMEGA-3 FISH OIL) 500 MG Cap Take by mouth daily as needed          ondansetron (ZOFRAN) 4 MG tablet Take 1 tablet (4 mg total) by mouth every 12 (twelve) hours as needed for Nausea 14 tablet 0    pantoprazole (PROTONIX) 40 MG tablet Take 1 tablet (40 mg total) by mouth every morning before breakfast. 30 tablet 0    pramipexole (MIRAPEX) 0.25 MG tablet Take 0.5 mg by mouth nightly  traMADol (ULTRAM) 50 MG tablet Take 1 tablet (50 mg total) by mouth every 6 (six) hours as needed for Pain 60 tablet 0    TURMERIC PO Take by mouth every morning          VITAMIN E PO Take by mouth every morning          White Petrolatum-Mineral Oil (SYSTANE NIGHTTIME) Ointment every evening  99       Past History     Past Medical History:  Past Medical History:   Diagnosis Date    Anxiety     Arthritis     Asthma     Bilateral cataracts     early stages    Depression     on prozac many yrs stable sees therapist    Difficulty walking     Diverticulitis     Dysphagia     Dyspnea on exertion     Ear, nose and throat disorder     seasonal allergies    Eczema     Gastroesophageal reflux disease     Hard to intubate     Health care maintenance     colonscopy 1/08-5 yr f/u dr Durwin Nora  pap-1/12  mammo-, dexa-1/12,mammo 3/13  tetanius shot 3/12, with MMR and hep A/B  had pneumovax age 24  NL CXR 05/30/11    History of colonoscopy 2007    MGM colon cancer 70  nl colonscopy 2007 dr Durwin Nora    Hyperlipidemia     sees cards dr.  Neomia Dear  9/12 chol 274 trigs 225 H53/L176 had myositis with statins,niacin-now on zetia had nl carotid duplex/thallium 1/12 Nl thallium-6/13  11/13 chol 260 H63/L173,ck= 305,cmp-nl creat 0.95-Referred to rheum 5/`14 re elevated cpk Saw Dr Neomia Dear 9/14-myalgia gone ,off tricor,but ck still even higher-echo/thallium ordred    Hypertension     Hypothyroidism     Osteoporosis     o'penia had been on reclast per gyn and ergocalciferol early 2012. dexa 1/12-T scores -1.1, -0.5    Restless legs syndrome     Routine history and physical examination of adult 02/2010    last Tetanus shot 2012    SAH (subarachnoid hemorrhage)     Syncope and collapse     Thyroid disease     hypothyroid on synthroid    Urinary tract infection        Past Surgical History:  Past Surgical History:   Procedure Laterality Date    ARTHROPLASTY, HIP TOTAL Left 07/11/2017    Procedure: ARTHROPLASTY, HIP TOTAL;  Surgeon: Lane Hacker, MD;  Location: [facility];  Service: Orthopedics;  Laterality: Left;    COLONOSCOPY      EGD N/A 08/28/2016    Procedure: EGD;  Surgeon: Lestine Mount, MD;  Location: [id number];  Service: Gastroenterology;  Laterality: N/A;    EYE SURGERY  2011    HYSTERECTOMY      complete laparoscopic hyterectomy,rectocele/cystocele repair 09/12/11 in [place]    maxillofacial  1981    NASAL SEPTUM SURGERY         Family History:  Family History   Problem Relation Age of Onset    Osteoporosis Mother     COPD Mother     Diabetes Mother     Arthritis Mother     Cancer Father     Cancer Paternal Grandfather        Social History:  Social History     Socioeconomic History    Marital status: Single  Spouse name: None    Number of children: None    Years of education: None    Highest education level: None   Occupational History    None   Social Engineer, site strain: None    Food insecurity     Worry: None     Inability: None    Transportation needs     Medical: None     Non-medical: None    Tobacco Use    Smoking status: Never Smoker    Smokeless tobacco: Never Used   Substance and Sexual Activity    Alcohol use: Yes     Comment: glass of wine once/month    Drug use: No    Sexual activity: None   Lifestyle    Physical activity     Days per week: None     Minutes per session: None    Stress: None   Relationships    Social connections     Talks on phone: None     Gets together: None     Attends religious service: None     Active member of club or organization: None     Attends meetings of clubs or organizations: None     Relationship status: None    Intimate partner violence     Fear of current or ex partner: None     Emotionally abused: None     Physically abused: None     Forced sexual activity: None   Other Topics Concern    None   Social History Narrative    None       Allergies:  Allergies   Allergen Reactions    Fenofibrate Swelling    Niacin Swelling    Onion Other (See Comments)     Gi distress    Peppers Other (See Comments)     GI distress    Statins      Muscle pain and liver enzymes elevated    Sulfa Antibiotics Hives    Sulfa Drugs Cross Reactors Hives    Colesevelam Rash    Erythromycin Rash    Neosporin [Neomycin-Bacitracin Zn-Polymyx] Rash    Penicillins Rash     Pt states she is NOT allergic to Penicillins. She said she has taken amoxicillin recently without issues; there is a also a dispensing record for Augmentin from 11/2016.        Review of Systems     Review of Systems   Constitutional: Negative for chills and fever.   Skin: Positive for wound. Negative for color change and rash.   All other systems reviewed and are negative.      Physical Exam     Vitals:    01/29/19 2116 01/29/19 2118   BP:  164/88   Pulse:  81   Resp:  16   Temp:  98.3 F (36.8 C)   TempSrc:  Oral   SpO2:  96%   Weight: 77.1 kg    Height: 5\' 6"  (1.676 m)        Physical Exam  Vitals signs and nursing note reviewed.   Constitutional:       General: She is not in acute distress.      Appearance: Normal appearance. She is well-developed.   HENT:      Head: Normocephalic.      Right Ear: External ear normal.      Left Ear: External ear normal.   Eyes:      Conjunctiva/sclera:  Conjunctivae normal.   Neck:      Musculoskeletal: Normal range of motion and neck supple.   Cardiovascular:      Rate and Rhythm: Normal rate.   Pulmonary:      Effort: Pulmonary effort is normal.   Abdominal:      General: There is no distension.   Musculoskeletal: Normal range of motion.   Skin:     General: Skin is warm and dry.      Comments: RLE wound pictured. 5cm area of fluctuance over medial portion of sutures without significant overlying erythema or warmth to touch. No purulence expressible.    Neurological:      Mental Status: She is alert and oriented to person, place, and time.                Diagnostic Study Results     Labs -     Results     ** No results found for the last 24 hours. **          Radiologic Studies -   Radiology Results (24 Hour)     ** No results found for the last 24 hours. **      .    Medical Decision Making   I am the first provider for this patient.    I reviewed the vital signs, available nursing notes, past medical history, past surgical history, family history and social history.    Vital Signs-Reviewed the patient's vital signs.     Patient Vitals for the past 12 hrs:   BP Temp Pulse Resp   01/29/19 2118 164/88 98.3 F (36.8 C) 81 16       Pulse Oximetry Analysis - Normal SpO2: 96 % on RA        Procedures:   --------------------------- PROCEDURE: Needle Aspiration  -------------------------    Performed by the emergency provider  Consent:  Informed consent, after discussion of the risks, benefits, and alternatives to the procedure was obtained.    Procedure Site: Fluid collection below suture site on anterior RLE  Indication: Fluid collection   Preparation: Area cleansed with betadine and anesthetized with Lido with epi  Procedure: Fine sterile 18g needle with US guidance aspirated  area with 8cc of serosanguinous fluid obtained.   Post-procedure: Swelling immediately improved, less tenderness. The patient tolerated the procedure well with no immediate complications. Pressure dressing applied    ------------------ SUTURE REMOVAL------------------    Preformed by the emergency provider.  Consent:  Informed consent was obtained from patient.  Timeout:  A timeout to verify the correct patient, procedure, and site was performed.  Procedure Site: RLE  Procedure:  In total 15 sutures were removed from the well healing wound.  Post-procedure: Steri strips x5 applied. Non-stick pressure-dressing. Pt instructed on appropriate wound care.      Old Medical Records: Old medical records.  Nursing notes.     ED Course:        Provider Notes:   RLE wound sutured 10 days ago with increased pain and mild drainage showed fluid collection on POCUS. No significant overlying erythema or signs of systemic infection, ddx seroma, hematoma, abscess. Fluid aspiration with serosang fluid. No longer concerned for infection, no further abx indicated. Pressure dressing.     Diagnosis     Clinical Impression:   1. Traumatic seroma of lower leg, right, initial encounter    2. Encounter for removal of sutures        Treatment Plan:   ED  Disposition     ED Disposition Condition Date/Time Comment    Discharge  Wed Jan 29, 2019 10:46 PM Tracy [name] Gusman discharge to home/self care.    Condition at disposition: Stable            _______________________________    CHART OWNERSHIP: I, Palmer Shorey Buck Mam, PA-C, am the primary clinician of record.  _______________________________     Arnoldo Morale, PA  01/30/19 [id number]       Everlean Cherry, MD  01/30/19 1319

## 2019-01-29 NOTE — ED Notes (Signed)
Bed: YE2  Expected date:   Expected time:   Means of arrival:   Comments:

## 2019-01-29 NOTE — ED Triage Notes (Signed)
Pt is here for suture removal

## 2019-01-29 NOTE — Discharge Instructions (Signed)
Seroma    You have been seen for a seroma.    A seroma is a pocket of fluid. It can form after surgery. It forms in the near the area operated on. Seromas are most common after breast cancer surgeries. They can happen anywhere in the body. This includes the abdomen (belly) after abdominal surgery.     Usually, the body absorbs a seroma within a month. However, they sometimes last for several months or even a year. The doctor may drain a seroma for different reasons. One is if the seroma is troublesome or painful. Another is if it lasts longer than usual. Many patients come back to get the seroma drained several times. This is because the fluid often comes back.     Sometimes, the seroma can get infected. A person can then get sick and have fever (temperature higher than 100.70F / 38C) and chills. They may have redness and pain around the seroma. The seroma sometimes has to be drained. Antibiotics may be needed.    During your visit, the seroma was drained.    YOU SHOULD SEEK MEDICAL ATTENTION IMMEDIATELY, EITHER HERE OR AT THE NEAREST EMERGENCY DEPARTMENT, IF ANY OF THE FOLLOWING OCCUR:   You have fever (temperature higher than 100.70F / 38C) or chills.   There is an area of redness around the seroma.   There are red streaks from the seroma extending toward the heart.   Whitish, yellow or greenish drainage comes from your seroma.   There is severe pain in the area.   You have other concerns.               Suture/Staple Removal    You have been seen for getting your sutures (stitches) or surgical staples taken out.    Your wound has healed. The sutures/staples have been taken out. However, the wound may still separate (reopen) if struck (hit). Keep protecting the wound to avoid further injury.    Keep putting a thin layer of antibiotic ointment on the wound. This reduces scarring and prevents infection. New scars are more likely to darken in the sun. Be sure to put sunscreen on regularly.    In  general, you can choose any antibiotic ointment or cream. However, Neosporin can be irritating to the skin and cause a rash. If this happens, stop using Neosporin. Switch to a "triple antibiotic cream" (also called Polysporin). This may irritate your skin much less.    Keep the wound clean and dry for the next 24 hours. Don't let it get too wet.    YOU SHOULD SEEK MEDICAL ATTENTION IMMEDIATELY, EITHER HERE OR AT THE NEAREST EMERGENCY DEPARTMENT, IF ANY OF THE FOLLOWING OCCURS:   Unusual redness or swelling.   Red streaks going up the arm or leg.   The wound smells bad or has a lot of drainage.   Pain when moving the extremity (arm or leg) or swollen lymph nodes. These are bumps normally found in the groin, armpit and neck.   Fever (temperature higher than 100.70F / 38C), chills, more pain or swelling.

## 2020-10-12 ENCOUNTER — Encounter (INDEPENDENT_AMBULATORY_CARE_PROVIDER_SITE_OTHER): Payer: Self-pay | Admitting: Neurology

## 2020-10-12 ENCOUNTER — Ambulatory Visit (INDEPENDENT_AMBULATORY_CARE_PROVIDER_SITE_OTHER): Payer: No Typology Code available for payment source | Admitting: Neurology

## 2020-10-12 VITALS — BP 130/76 | HR 63 | Ht 66.0 in | Wt 170.0 lb

## 2020-10-12 DIAGNOSIS — G2581 Restless legs syndrome: Secondary | ICD-10-CM

## 2020-10-12 MED ORDER — PRAMIPEXOLE DIHYDROCHLORIDE 0.25 MG PO TABS
0.50 mg | ORAL_TABLET | Freq: Every evening | ORAL | 3 refills | Status: AC
Start: 2020-10-12 — End: ?

## 2020-10-12 NOTE — Progress Notes (Signed)
Subjective:           Patient ID: Tracy [name] is a 70 y.o. female here for restless leg       HPI    70 y.o. female with PMH of HTN, HLP, hypothyroidism, spontaneous SAH/SDH, depression/anxiety, RLS here to establish care.    She completed a sleep study many years ago.  Feels urge to move legs, better with movement, worse at night.  Tx Pramipexole 0.5mg  QHS  Daytime fatigue can be an issue, does take naps after activity  Denies tremors, balance difficulty, falls, weakness, sensory loss, loss of smell/taste, myaglias, arthralgias, nightmares, mood swings  Occasional numbness in soles of feet.    She still reports issues with dysphagia after maxillofacial surgery 1981, never completed PT.      Review of Systems All systems were reviewed and were negative except as described in the HPI.    Current/Home Medications    CHOLECALCIFEROL (VITAMIN D) 1000 UNITS CAPSULE    Take 1,000 Units by mouth every morning        CO-ENZYME Q-10 30 MG CAPSULE    Take 30 mg by mouth daily as needed        DESOXIMETASONE (TOPICORT) 0.05 % CREAM    Apply topically as needed        EVOLOCUMAB (REPATHA) 140 MG/ML SUBCUTANEOUS AUTO-INJECTOR    1 mL Every 2 weeks    EZETIMIBE (ZETIA) 10 MG TABLET    Take 10 mg by mouth every evening        FLUOXETINE (PROZAC) 10 MG TABLET    Take 10 mg by mouth every morning        FLUOXETINE (PROZAC) 20 MG CAPSULE    Take 20 mg by mouth every morning    FLUTICASONE (FLONASE) 50 MCG/ACT NASAL SPRAY    INSTILL SPRAY IN NOSTRIL TWICE A DAY    GABAPENTIN (NEURONTIN) 300 MG CAPSULE    TAKE 1 TABLET AT NIGHT    GLUCOSAMINE-CHONDROITIN 500-400 MG TABLET    Take 1 tablet by mouth as needed        HYDROCODONE-ACETAMINOPHEN (NORCO) 5-325 MG PER TABLET    Take 1-2 tablets by mouth every 4 (four) hours as needed for Pain    LEVOTHYROXINE (SYNTHROID, LEVOTHROID) 88 MCG TABLET    Take 88 mcg by mouth every morning        LIDOCAINE (LIDODERM) 5 %    Place 1 patch onto the skin every 24 hours Remove & Discard patch  within 12 hours or as directed by MD    MULTIPLE VITAMINS-MINERALS (MULTIVITAMIN WITH MINERALS) TABLET    Take 1 tablet by mouth every morning        OMEGA-3 FATTY ACIDS (OMEGA-3 FISH OIL) 500 MG CAP    Take by mouth daily as needed        ONDANSETRON (ZOFRAN) 4 MG TABLET    Take 1 tablet (4 mg total) by mouth every 12 (twelve) hours as needed for Nausea    PANTOPRAZOLE (PROTONIX) 40 MG TABLET    Take 1 tablet (40 mg total) by mouth every morning before breakfast.    PRAMIPEXOLE (MIRAPEX) 0.25 MG TABLET    Take 0.5 mg by mouth nightly        TRAMADOL (ULTRAM) 50 MG TABLET    Take 1 tablet (50 mg total) by mouth every 6 (six) hours as needed for Pain    TURMERIC PO    Take by mouth every morning  VITAMIN E PO    Take by mouth every morning        WHITE PETROLATUM-MINERAL OIL (SYSTANE NIGHTTIME) OINTMENT    every evening       The following portions of the patient's history were reviewed and updated as appropriate: allergies, current medications, past family history, past medical history, past social history, past surgical history and problem list.  No pertinent family history, except what is stated in HPI.          Objective:      Physical Exam Neurologic Exam    General: The patient was well developed and well nourished.  No acute distress.  Neck:  no carotid bruits  CVS: RRR, no murmurs, rubs,or gallops  Lungs: clear to auscultation bilaterally  Extremities: no pedal edema, extremities normal in color    Mental Status: The patient was awake, alert and oriented X4.  Normal affect.  Recent and remote memory appeared to be normal.   Attention span and concentration appear normal.  Fluent without aphasia.   Cranial nerves: Pupils are equal, round and reactive to light.  Visual fields full.    EOM intact.  No ptosis.  No diplopia.  No nystagmus.  Facial sensation intact.   Face symmetric.  No dysarthria.  Hearing grossly intact.  Shoulder shrug symmetric.  Tongue protrudes midline.  Uvula is midline.  Motor:  Muscle tone normal. No atrophy.  No fasiculations. No pronator drift.  Strength  R / L    R / L  Deltoid  5 / 5  Hip Flexion 5 / 5  Triceps  5 / 5   Hip extension 5 / 5  Biceps   5 / 5   Knee flexion 5 / 5  Wrist ext 5 / 5  Knee ext 5 / 5  Wrist flexion 5 / 5  Dorsiflexion 5 / 5  FF   5 / 5  Plantar flexion 5 / 5  Sensory:   Light touch intact.  Pinprick intact.  Temperature intact.  Vibration intact.    Reflexes:  R / L     R / L  Biceps  2 / 2  Knees  2 / 2  Triceps 2 / 2  Ankles  2 / 2  Brachioradialis2 / 2  Babinksi Down / down  Coordination: FTN and HKS intact, no truncal ataxia. RAMs intact. No tremors  Gait: Stable. Good stride length, good initiation.  Intact to tandem, heel, toe walk. Negative Romberg.      Imaging/Testing:        Assessment:         70 y.o. female with PMH of HTN, HLP, hypothyroidism, spontaneous SAH/SDH, depression/anxiety, RLS here to establish care.  She is stable on pramipexole.        Plan:      - cont Pramipexole 0.5mg  QHS  - check Ferritin/Fe for reversible causes for RLS      Charlynn Court, MD

## 2020-11-06 ENCOUNTER — Emergency Department
Admission: EM | Admit: 2020-11-06 | Discharge: 2020-11-06 | Disposition: A | Discharge status: Home / Self Care | Payer: No Typology Code available for payment source | Attending: Emergency Medicine | Admitting: Emergency Medicine

## 2020-11-06 ENCOUNTER — Emergency Department: Payer: No Typology Code available for payment source

## 2020-11-06 DIAGNOSIS — R202 Paresthesia of skin: Secondary | ICD-10-CM | POA: Insufficient documentation

## 2020-11-06 DIAGNOSIS — R200 Anesthesia of skin: Secondary | ICD-10-CM

## 2020-11-06 LAB — CBC AND DIFFERENTIAL
Absolute NRBC: 0 10*3/uL (ref 0.00–0.00)
Basophils Absolute Automated: 0.06 10*3/uL (ref 0.00–0.08)
Basophils Automated: 0.7 %
Eosinophils Absolute Automated: 0.29 10*3/uL (ref 0.00–0.44)
Eosinophils Automated: 3.4 %
Hematocrit: 45.5 % — ABNORMAL HIGH (ref 34.7–43.7)
Hgb: 14.9 g/dL — ABNORMAL HIGH (ref 11.4–14.8)
Immature Granulocytes Absolute: 0.03 10*3/uL (ref 0.00–0.07)
Immature Granulocytes: 0.4 %
Lymphocytes Absolute Automated: 2.21 10*3/uL (ref 0.42–3.22)
Lymphocytes Automated: 26 %
MCH: 28.2 pg (ref 25.1–33.5)
MCHC: 32.7 g/dL (ref 31.5–35.8)
MCV: 86 fL (ref 78.0–96.0)
MPV: 9 fL (ref 8.9–12.5)
Monocytes Absolute Automated: 0.62 10*3/uL (ref 0.21–0.85)
Monocytes: 7.3 %
Neutrophils Absolute: 5.28 10*3/uL (ref 1.10–6.33)
Neutrophils: 62.2 %
Nucleated RBC: 0 /100 WBC (ref 0.0–0.0)
Platelets: 297 10*3/uL (ref 142–346)
RBC: 5.29 10*6/uL — ABNORMAL HIGH (ref 3.90–5.10)
RDW: 14 % (ref 11–15)
WBC: 8.49 10*3/uL (ref 3.10–9.50)

## 2020-11-06 LAB — COMPREHENSIVE METABOLIC PANEL
ALT: 22 U/L (ref 0–55)
AST (SGOT): 23 U/L (ref 5–41)
Albumin/Globulin Ratio: 1.2 (ref 0.9–2.2)
Albumin: 3.9 g/dL (ref 3.5–5.0)
Alkaline Phosphatase: 72 U/L (ref 37–117)
Anion Gap: 9 (ref 5.0–15.0)
BUN: 20 mg/dL (ref 7.0–21.0)
Bilirubin, Total: 0.4 mg/dL (ref 0.2–1.2)
CO2: 25 mEq/L (ref 17–29)
Calcium: 9.3 mg/dL (ref 7.9–10.2)
Chloride: 110 mEq/L (ref 99–111)
Creatinine: 0.8 mg/dL (ref 0.4–1.0)
Globulin: 3.2 g/dL (ref 2.0–3.6)
Glucose: 91 mg/dL (ref 70–100)
Potassium: 3.8 mEq/L (ref 3.5–5.3)
Protein, Total: 7.1 g/dL (ref 6.0–8.3)
Sodium: 144 mEq/L (ref 135–145)

## 2020-11-06 LAB — URINALYSIS REFLEX TO MICROSCOPIC EXAM - REFLEX TO CULTURE
Bilirubin, UA: NEGATIVE
Glucose, UA: NEGATIVE
Ketones UA: NEGATIVE
Leukocyte Esterase, UA: NEGATIVE
Nitrite, UA: NEGATIVE
Protein, UR: NEGATIVE
Specific Gravity UA: 1.027 (ref 1.001–1.035)
Urine pH: 5.5 (ref 5.0–8.0)
Urobilinogen, UA: NEGATIVE mg/dL (ref 0.2–2.0)

## 2020-11-06 LAB — GLUCOSE WHOLE BLOOD - POCT: Whole Blood Glucose POCT: 97 mg/dL (ref 70–100)

## 2020-11-06 LAB — PT AND APTT
PT INR: 1 (ref 0.9–1.1)
PT: 11.5 s (ref 10.1–12.9)
PTT: 33 s (ref 27–39)

## 2020-11-06 LAB — TROPONIN I: Troponin I: <0.01 ng/mL (ref 0.00–0.05)

## 2020-11-06 LAB — GFR: EGFR: >60

## 2020-11-06 MED ORDER — ONDANSETRON HCL 4 MG/2ML IJ SOLN
4.0000 mg | Freq: Once | INTRAMUSCULAR | Status: AC
Start: 2020-11-06 — End: 2020-11-06
  Administered 2020-11-06: 12:00:00 4 mg via INTRAVENOUS
  Filled 2020-11-06: qty 2

## 2020-11-06 MED ORDER — ONDANSETRON 4 MG PO TBDP
4.0000 mg | ORAL_TABLET | Freq: Four times a day (QID) | ORAL | 0 refills | Status: DC | PRN
Start: 2020-11-06 — End: 2020-11-06

## 2020-11-06 MED ORDER — ONDANSETRON 4 MG PO TBDP
4.0000 mg | ORAL_TABLET | Freq: Four times a day (QID) | ORAL | 0 refills | Status: AC | PRN
Start: 2020-11-06 — End: ?

## 2020-11-06 NOTE — Discharge Instructions (Signed)
Return to the ED for any severe pain, numbness or weakness, or any concerns.

## 2020-11-06 NOTE — ED Triage Notes (Signed)
Tracy [name] is a 70 y.o. female presents to the ED for c/o headache, nausea and R sided tingling that started suddenly aprox 30 mins PTA.

## 2020-11-06 NOTE — ED Provider Notes (Signed)
EMERGENCY DEPARTMENT HISTORY AND PHYSICAL EXAM     None        Date: 11/06/2020  Patient Name: Tracy [name]    History of Presenting Illness     Chief Complaint   Patient presents with    Numbness       History Provided By: Patient    Chief Complaint: HA, R sided numbness/tingling  Duration: this morning pta 8:30 am       Additional History: Tracy [name] is a 70 y.o. female presenting to the ED with sudden but not severe bitemporal HA this morning at 8:30 am when sitting [place] [employer] with nausea and R sided numbness/tingling without focal weakness. No syncope, drive here. No acute falls or head injury, vision changes, weakness, abd pain, cp, sob, cough, fevers, dysuria. Hx sah and states this feels different and HA not severe like that      PCP: Randel Pigg, MD  SPECIALISTS:    No current facility-administered medications for this encounter.     Current Outpatient Medications   Medication Sig Dispense Refill    Cholecalciferol (VITAMIN D) 1000 UNITS capsule Take 1,000 Units by mouth every morning          desoximetasone (TOPICORT) 0.05 % cream Apply topically as needed          evolocumab (REPATHA) 140 MG/ML subcutaneous auto-injector 1 mL Every 2 weeks (Patient not taking: Reported on 10/12/2020)      ezetimibe (ZETIA) 10 MG tablet Take 10 mg by mouth every evening          FLUoxetine (PROZAC) 10 MG tablet Take 10 mg by mouth every morning          FLUoxetine (PROZAC) 20 MG capsule Take 20 mg by mouth every morning      fluticasone (FLONASE) 50 MCG/ACT nasal spray INSTILL SPRAY IN NOSTRIL TWICE A DAY  3    glucosamine-chondroitin 500-400 MG tablet Take 1 tablet by mouth as needed     (Patient not taking: Reported on 10/12/2020)      levothyroxine (SYNTHROID, LEVOTHROID) 88 MCG tablet Take 88 mcg by mouth every morning          lidocaine (LIDODERM) 5 % Place 1 patch onto the skin every 24 hours Remove & Discard patch within 12 hours or as directed by MD 30 each 0    Multiple Vitamins-Minerals  (MULTIVITAMIN WITH MINERALS) tablet Take 1 tablet by mouth every morning          Omega-3 Fatty Acids (OMEGA-3 FISH OIL) 500 MG Cap Take by mouth daily as needed          ondansetron (ZOFRAN-ODT) 4 MG disintegrating tablet Take 1 tablet (4 mg total) by mouth every 6 (six) hours as needed for Nausea 8 tablet 0    pantoprazole (PROTONIX) 40 MG tablet Take 1 tablet (40 mg total) by mouth every morning before breakfast. 30 tablet 0    pramipexole (MIRAPEX) 0.25 MG tablet Take 2 tablets (0.5 mg total) by mouth nightly 90 tablet 3    TURMERIC PO Take by mouth every morning          VITAMIN E PO Take by mouth every morning          White Petrolatum-Mineral Oil (SYSTANE NIGHTTIME) Ointment every evening  99       Past History     Past Medical History:  Past Medical History:   Diagnosis Date    Anxiety  Arthritis     Asthma     Bilateral cataracts     early stages    Depression     on prozac many yrs stable sees therapist    Difficulty walking     Diverticulitis     Dysphagia     Dyspnea on exertion     Ear, nose and throat disorder     seasonal allergies    Eczema     Gastroesophageal reflux disease     Hard to intubate     Health care maintenance     colonscopy 1/08-5 yr f/u dr Durwin Nora  pap-1/12  mammo-, dexa-1/12,mammo 3/13  tetanius shot 3/12, with MMR and hep A/B  had pneumovax age 20  NL CXR 05/30/11    History of colonoscopy 2007    MGM colon cancer 70  nl colonscopy 2007 dr Durwin Nora    Hyperlipidemia     sees cards dr. Neomia Dear  9/12 chol 274 trigs 225 H53/L176 had myositis with statins,niacin-now on zetia had nl carotid duplex/thallium 1/12 Nl thallium-6/13  11/13 chol 260 H63/L173,ck= 305,cmp-nl creat 0.95-Referred to rheum 5/`14 re elevated cpk Saw Dr Neomia Dear 9/14-myalgia gone ,off tricor,but ck still even higher-echo/thallium ordred    Hypertension     Hypothyroidism     Osteoporosis     o'penia had been on reclast per gyn and ergocalciferol early 2012. dexa 1/12-T scores -1.1, -0.5    Restless legs syndrome      Routine history and physical examination of adult 02/2010    last Tetanus shot 2012    SAH (subarachnoid hemorrhage)     Syncope and collapse     Thyroid disease     hypothyroid on synthroid    Urinary tract infection        Past Surgical History:  Past Surgical History:   Procedure Laterality Date    ARTHROPLASTY, HIP TOTAL Left 07/11/2017    Procedure: ARTHROPLASTY, HIP TOTAL;  Surgeon: Lane Hacker, MD;  Location: [facility];  Service: Orthopedics;  Laterality: Left;    COLONOSCOPY      EGD N/A 08/28/2016    Procedure: EGD;  Surgeon: Lestine Mount, MD;  Location: [facility];  Service: Gastroenterology;  Laterality: N/A;    EYE SURGERY  2011    HYSTERECTOMY      complete laparoscopic hyterectomy,rectocele/cystocele repair 09/12/11 in [place]    maxillofacial  1981    NASAL SEPTUM SURGERY         Family History:  Family History   Problem Relation Age of Onset    Osteoporosis Mother     COPD Mother     Diabetes Mother     Arthritis Mother     Cancer Father     Cancer Paternal Grandfather        Social History:  Social History     Tobacco Use    Smoking status: Never    Smokeless tobacco: Never   Vaping Use    Vaping Use: Never used   Substance Use Topics    Alcohol use: Yes     Comment: glass of wine once/month    Drug use: No       Allergies:  Allergies   Allergen Reactions    Fenofibrate Swelling    Niacin Swelling    Onion Other (See Comments)     Gi distress    Peppers Other (See Comments)     GI distress    Statins      Muscle pain  and liver enzymes elevated    Sulfa Antibiotics Hives    Sulfa Drugs Cross Reactors Hives    Colesevelam Rash    Erythromycin Rash    Neosporin [Neomycin-Bacitracin Zn-Polymyx] Rash    Penicillins Rash     Pt states she is NOT allergic to Penicillins. She said she has taken amoxicillin recently without issues; there is a also a dispensing record for Augmentin from 11/2016.        Review of Systems     Review of Systems   Constitutional:  Negative for fever.   Respiratory:   Negative for cough and shortness of breath.    Cardiovascular:  Negative for chest pain.   Gastrointestinal:  Negative for abdominal pain, diarrhea, nausea and vomiting.   Genitourinary:  Negative for dysuria.   Musculoskeletal:  Negative for neck pain.   Neurological:  Positive for numbness. Negative for dizziness, syncope, facial asymmetry, speech difficulty, weakness, light-headedness and headaches.   Psychiatric/Behavioral:  Negative for confusion.    All other systems reviewed and are negative.    Physical Exam   BP 125/60   Pulse 64   Temp 98.1 F (36.7 C)   Resp 15   Ht 5\' 6"  (1.676 m)   Wt 77.6 kg   SpO2 99%   BMI 27.60 kg/m     Physical Exam   Constitutional: Patient is oriented to person, place, and time and well-developed, well-nourished, and in no distress.   Head: Normocephalic and atraumatic.   Eyes: EOM are normal. Pupils are equal, round, and reactive to light. pink subconj. No scleral icterus  ENT: OP clear, MMM  Neck: Normal range of motion. Neck supple. No JVD. No meningmus  Cardiovascular: Normal rate and regular rhythm. No murmurs or rubs.  Pulmonary/Chest: Effort normal and breath sounds normal. No respiratory distress.   Abdominal: Soft. There is no tenderness. No rebound or guarding.  Musculoskeletal: Normal range of motion. No lower extremity edema.  Neurological: Patient is alert and oriented to person, place, and time. GCS score is 15. Normal speech. No drift bue or ble. Motor 5/5 bue and ble. Sensation intact to light touch bue and ble  Skin: Skin is warm and dry.     Diagnostic Study Results     Labs -     Results       Procedure Component Value Units Date/Time    Urinalysis Reflex to Microscopic Exam- Reflex to Culture [[id number]]  (Abnormal) Collected: 11/06/20 0925    Specimen: Urine Updated: 11/06/20 0947     Urine Type Urine, Clean Ca     Color, UA Yellow     Clarity, UA Clear     Specific Gravity UA 1.027     Urine pH 5.5     Leukocyte Esterase, UA Negative     Nitrite, UA  Negative     Protein, UR Negative     Glucose, UA Negative     Ketones UA Negative     Urobilinogen, UA Negative mg/dL      Bilirubin, UA Negative     Blood, UA Trace     RBC, UA 0 - 2 /hpf      WBC, UA 0 - 5 /hpf      Squamous Epithelial Cells, Urine 0 - 5 /hpf     Troponin I [[id number]] Collected: 11/06/20 0859    Specimen: Blood Updated: 11/06/20 0928     Troponin I <0.01 ng/mL     PT/APTT [[id number]] Collected: 11/06/20 0859  Updated: 11/06/20 0926     PT 11.5 sec      PT INR 1.0     PTT 33 sec     GFR [[id number]] Collected: 11/06/20 0859     Updated: 11/06/20 0925     EGFR >60.0       Comprehensive metabolic panel [[id number]] Collected: 11/06/20 0859    Specimen: Blood Updated: 11/06/20 0925     Glucose 91 mg/dL      BUN 78.2 mg/dL      Creatinine 0.8 mg/dL      Sodium 956 mEq/L      Potassium 3.8 mEq/L      Chloride 110 mEq/L      CO2 25 mEq/L      Calcium 9.3 mg/dL      Protein, Total 7.1 g/dL      Albumin 3.9 g/dL      AST (SGOT) 23 U/L      ALT 22 U/L      Alkaline Phosphatase 72 U/L      Bilirubin, Total 0.4 mg/dL      Globulin 3.2 g/dL      Albumin/Globulin Ratio 1.2     Anion Gap 9.0    CBC and differential [[id number]]  (Abnormal) Collected: 11/06/20 0859    Specimen: Blood Updated: 11/06/20 0916     WBC 8.49 x10 3/uL      Hgb 14.9 g/dL      Hematocrit 46.9 %      Platelets 297 x10 3/uL      RBC 5.29 x10 6/uL      MCV 86.0 fL      MCH 28.2 pg      MCHC 32.7 g/dL      RDW 14 %      MPV 9.0 fL      Neutrophils 62.2 %      Lymphocytes Automated 26.0 %      Monocytes 7.3 %      Eosinophils Automated 3.4 %      Basophils Automated 0.7 %      Immature Granulocytes 0.4 %      Nucleated RBC 0.0 /100 WBC      Neutrophils Absolute 5.28 x10 3/uL      Lymphocytes Absolute Automated 2.21 x10 3/uL      Monocytes Absolute Automated 0.62 x10 3/uL      Eosinophils Absolute Automated 0.29 x10 3/uL      Basophils Absolute Automated 0.06 x10 3/uL      Immature Granulocytes Absolute 0.03 x10 3/uL      Absolute NRBC 0.00  x10 3/uL     Glucose Whole Blood - POCT [[id number]] Collected: 11/06/20 0859     Updated: 11/06/20 0901     Whole Blood Glucose POCT 97 mg/dL             Radiologic Studies -   Radiology Results (24 Hour)       Procedure Component Value Units Date/Time    Chest AP Portable [[id number]] Collected: 11/06/20 5366    Order Status: Completed Updated: 11/06/20 0912    Narrative:      History: Stroke-like symptoms    Technique: Single Portable View    Comparison: None.    Findings:  The lungs appear clear.  There is no pneumothorax.  The heart is normal in size.    The mediastinum is within normal limits.          Impression:       No active disease  is seen in the chest.    Laurena Slimmer, MD   11/06/2020 9:09 AM    CT Head without Contrast [[id number]] Collected: 11/06/20 0858    Order Status: Completed Updated: 11/06/20 0901    Narrative:      CT HEAD WO CONTRAST:11/06/2020 8:43 AM     CLINICAL HISTORY: .Headache, intracranial hemorrhage suspected          TECHNIQUE: Multiple 5 mm thickness axial images were obtained from the  skull base to the vertex without IV contrast administration. Coronal and  sagittal reformatted images were then obtained.    Note that CT scanning at this site utilizes multiple dose reduction  techniques including automatic exposure control, adjustment of the MAS  and/or KVP according to patient size, and use of iterative  reconstruction technique.    FINDINGS: Gray-white matter junction differentiation is appropriate. No  intracranial hemorrhage is identified. The ventricles are appropriate in  size, shape and are midline. There is no intracranial mass, mass-effect  or midline shift. No abnormal intra or extra-axial fluid collections are  identified.     The visualized paranasal sinuses and mastoid air cells are well-aerated.  The osseous calvarium is intact.      Impression:        No CT evidence of an acute intracranial abnormality.        No change from CT HEAD WO CONTRAST with report dated 01/24/2018  8:25 PM.    Laurena Slimmer, MD   11/06/2020 8:59 AM        .    Medical Decision Making   I am the first provider for this patient.    I reviewed the vital signs, available nursing notes, past medical history, past surgical history, family history and social history.    Vital Signs-Reviewed the patient's vital signs.   Patient Vitals for the past 12 hrs:   BP Temp Pulse Resp   11/06/20 1151 125/60 98.1 F (36.7 C) 64 15   11/06/20 0908 129/69 -- 60 --   11/06/20 0845 155/77 98.2 F (36.8 C) 68 16       Pulse Oximetry Analysis - Normal 99% on RA         EKG:  Interpreted by the EP.   Time Interpreted: 8:45   Rate: 63   Rhythm: Normal Sinus Rhythm     Interpretation: no st elev or depressions       Clinical Decision Support:   NIH Stroke Score      Flowsheet Row Most Recent Value   Patient's calculated Stroke Score: 0 filed at 11/06/2020 4098            Old Medical Records: Nursing notes.     ED Course:    9am- neuro dr. Hosie Poisson eval pt. Hct neg for ich on our review. Recommends mri brain/mra head and neck and if neg, might be able to [place] home    12p- pt without any sx now, no HA or numbness.  Mri not ready as another pt on mri table. Pt states she needs to go home to walk her dog and can't stay longer, does not want to be admitted    D/w dr. Hosie Poisson- as asx, ok for [place] and he will refer pt to dr. Stacy Gardner    Pt also states no LP after discussing risks vs benefits of LP to r/o [facility]    The patient verbalizes understanding of the test results, short and long term treatment plan, indications to return  to emergency department, need for immediate follow up, and possible medication side effects. The patient agrees with discharge home at this time.    Provider Notes (Initial Assessment):  here for resolved HA, R side numbness/tingling, drive here. No deficits in ED. nihss=0. Hct neg for ich or sah. Plan was for ED mri/mra after d/w neuro but then pt couldn't wait for ED mri's and wanted to go home to walk her dog. Given outpt neuro  info        Diagnosis     Clinical Impression:   1. Paresthesia        Treatment Plan:   ED Disposition       ED Disposition   Discharge    Condition   --    Date/Time   Sat Nov 06, 2020 1218    Comment   Deborrah Mabin Plocher discharge to home/self care.    Condition at disposition: Stable                   _______________________________  Attestations:     None        I am the first provider for this patient.    Marquette Old, MD is the primary emergency doctor of record.    I reviewed the vital signs, available nursing notes, past medical history, past surgical history, family history and social history.       Marquette Old, MD  11/06/20 [id number]

## 2020-11-06 NOTE — ED Notes (Signed)
Called MRI. MRI eta 1230.

## 2020-11-06 NOTE — ED Notes (Signed)
MRI form completed. MRI notified, pt ready for MRI.

## 2020-11-06 NOTE — ED Notes (Signed)
Bed: BL16  Expected date:   Expected time:   Means of arrival:   Comments:  Stoke alert

## 2020-11-06 NOTE — Consults (Signed)
[facility] Consultation Note                                       Date Time: 11/06/20 9:18 AM  Patient Name: Mcquade,Dajha MICHELLE  Requesting Physician: Marquette Old, MD  Date of Admission: 11/06/2020    CC / Reason for Consultation: headache, numbness         Assessment:     70y/o woman with hx of HTN, HLD, spontaneous SAH, RLS presenting with sudden onset of headache and right sided numbness. Symptom onset at 0830 on 09/17.     CT head imaging reviewed and no acute process noted. Exam with subjective sensory changes on the right face, arm and leg, otherwise non-focal.     Suspect atypical migraine vs less likely acute stroke. Would not proceed with IV alteplase based on history of SAH and overall midlness/non-disabling nature of her deficits (NIHSS 1 for sensory changes). Low suspicion for LVO.       Plan:     --MRI brain, MRA head/neck  --can consider migraine cocktail for symptomatic relief of headache  --further workup and plan pending MRI results. Can likely d/c home if MRI negative      HPI   Johnnetta Holstine is a 70 y.o. female with hx of HTN, HLD, spontaneous SAH, RLS presenting with sudden onset of headache and right sided numbness. Notes symptom onset around 0830 this morning. Notes initial onset of a left sided sharp stabbing pain headache, shortly after developed numbness and a pins and needles sensation involving the right face, arm and leg. Denies any focal weakness, no vertigo, no gait instability. Notes some impaired vision in her right eye but unable to delineate if it is double vision, blurred vision. Also noted some "spasms" of the muscles around her right eye. Patient was able to drive herself to the ED. Reports waking up fine this morning.     Was admitted to [facility] on 08/24/16 for spontaneous SAD/SDH in the setting of new onset worst headache of her life. At that time she also endorsed mild blurry vision in her right peripheral vision and weakness in her LUE and LLE. SAH  felt to be nontraumatic in setting of vigorous coughing/choking episode. Cerebral angiogram with no evidence of aneurysm or avm.     Onset: 0830  Timing: sudden onset  Location: right side  Quality: pins and needles sensation  Severity: mild  Exacerbating factors: none  Alleviating factors: none  Associated Symptoms: headache, right side blurred vision  Pertinent Negatives: per HPI    Past Medical Hx     Past Medical History:   Diagnosis Date    Anxiety     Arthritis     Asthma     Bilateral cataracts     early stages    Depression     on prozac many yrs stable sees therapist    Difficulty walking     Diverticulitis     Dysphagia     Dyspnea on exertion     Ear, nose and throat disorder     seasonal allergies    Eczema     Gastroesophageal reflux disease     Hard to intubate     Health care maintenance     colonscopy 1/08-5 yr f/u dr Durwin Nora  pap-1/12  mammo-, dexa-1/12,mammo 3/13  tetanius shot 3/12, with MMR and hep A/B  had pneumovax age  58  NL CXR 05/30/11    History of colonoscopy 2007    MGM colon cancer 70  nl colonscopy 2007 dr Durwin Nora    Hyperlipidemia     sees cards dr. Neomia Dear  9/12 chol 274 trigs 225 H53/L176 had myositis with statins,niacin-now on zetia had nl carotid duplex/thallium 1/12 Nl thallium-6/13  11/13 chol 260 H63/L173,ck= 305,cmp-nl creat 0.95-Referred to rheum 5/`14 re elevated cpk Saw Dr Neomia Dear 9/14-myalgia gone ,off tricor,but ck still even higher-echo/thallium ordred    Hypertension     Hypothyroidism     Osteoporosis     o'penia had been on reclast per gyn and ergocalciferol early 2012. dexa 1/12-T scores -1.1, -0.5    Restless legs syndrome     Routine history and physical examination of adult 02/2010    last Tetanus shot 2012    SAH (subarachnoid hemorrhage)     Syncope and collapse     Thyroid disease     hypothyroid on synthroid    Urinary tract infection           Past Surgical Hx:     Past Surgical History:   Procedure Laterality Date    ARTHROPLASTY, HIP TOTAL Left 07/11/2017     Procedure: ARTHROPLASTY, HIP TOTAL;  Surgeon: Lane Hacker, MD;  Location: [facility];  Service: Orthopedics;  Laterality: Left;    COLONOSCOPY      EGD N/A 08/28/2016    Procedure: EGD;  Surgeon: Lestine Mount, MD;  Location: [facility];  Service: Gastroenterology;  Laterality: N/A;    EYE SURGERY  2011    HYSTERECTOMY      complete laparoscopic hyterectomy,rectocele/cystocele repair 09/12/11 in [place]    maxillofacial  1981    NASAL SEPTUM SURGERY          Family Medical History:      Family History   Problem Relation Age of Onset    Osteoporosis Mother     COPD Mother     Diabetes Mother     Arthritis Mother     Cancer Father     Cancer Paternal Grandfather        Social Hx     Social History     Socioeconomic History    Marital status: Single   Tobacco Use    Smoking status: Never    Smokeless tobacco: Never   Vaping Use    Vaping Use: Never used   Substance and Sexual Activity    Alcohol use: Yes     Comment: glass of wine once/month    Drug use: No       Meds     Home :   Prior to Admission medications    Medication Sig Start Date End Date Taking? Authorizing Provider   Cholecalciferol (VITAMIN D) 1000 UNITS capsule Take 1,000 Units by mouth every morning        [provider]   desoximetasone (TOPICORT) 0.05 % cream Apply topically as needed        [provider]   evolocumab (REPATHA) 140 MG/ML subcutaneous auto-injector 1 mL Every 2 weeks  Patient not taking: Reported on 10/12/2020    [provider]   ezetimibe (ZETIA) 10 MG tablet Take 10 mg by mouth every evening        [provider]   FLUoxetine (PROZAC) 10 MG tablet Take 10 mg by mouth every morning        [provider]   FLUoxetine (PROZAC) 20 MG capsule  Take 20 mg by mouth every morning    [provider]   fluticasone (FLONASE) 50 MCG/ACT nasal spray INSTILL SPRAY IN NOSTRIL TWICE A DAY 01/16/16   [provider]   glucosamine-chondroitin 500-400 MG tablet Take 1 tablet by  mouth as needed      Patient not taking: Reported on 10/12/2020    [provider]   levothyroxine (SYNTHROID, LEVOTHROID) 88 MCG tablet Take 88 mcg by mouth every morning        [provider]   lidocaine (LIDODERM) 5 % Place 1 patch onto the skin every 24 hours Remove & Discard patch within 12 hours or as directed by MD 01/24/18   Tenny Craw, MD   Multiple Vitamins-Minerals (MULTIVITAMIN WITH MINERALS) tablet Take 1 tablet by mouth every morning        [provider]   Omega-3 Fatty Acids (OMEGA-3 FISH OIL) 500 MG Cap Take by mouth daily as needed        [provider]   pantoprazole (PROTONIX) 40 MG tablet Take 1 tablet (40 mg total) by mouth every morning before breakfast. 08/28/16   Les Pou, MD   pramipexole (MIRAPEX) 0.25 MG tablet Take 2 tablets (0.5 mg total) by mouth nightly 10/12/20   Charlynn Court, MD   TURMERIC PO Take by mouth every morning        [provider]   VITAMIN E PO Take by mouth every morning        [provider]   White Petrolatum-Mineral Oil (SYSTANE NIGHTTIME) Ointment every evening 11/18/15   [provider]      Inpatient :       Allergies    Fenofibrate, Niacin, Onion, Peppers, Statins, Sulfa antibiotics, Sulfa drugs cross reactors, Colesevelam, Erythromycin, Neosporin [neomycin-bacitracin zn-polymyx], and Penicillins      Review of Systems     All other systems were reviewed and are negative except for that mentioned in the HPI    Physical Exam:   Temp:  [98.2 F (36.8 C)] 98.2 F (36.8 C)  Heart Rate:  [60-68] 60  Resp Rate:  [16] 16  BP: (129-155)/(69-77) 129/69       General: in no acute distress. Cooperative with the exam  Neck: supple  CVS: warm and well perfused  Resp: no respiratory distress  Extremities: no pedal edema, no rashes noted    Neurological Examination:  MSE: A&Ox3, speech fluent without word-finding difficulty or paraphasic errors  CN: Pupils 4-->40mm b/l, EOMI, no nystagmus, VFFC,  decreased LT on R V1-V3, smile symmetric, tongue and palate midline.  No dysarthria.  Motor: No pronator drift.  Strength 5/5 throughout, normal tone.  Sensory: Decreased LT on RUE and RLE.  No extinction to visual or tactile DSS.  Coord: FNF, RAM without limb ataxia or dysmetria.  Gait: deferred    Labs:     Results       Procedure Component Value Units Date/Time    CBC and differential [[id number]]  (Abnormal) Collected: 11/06/20 0859    Specimen: Blood Updated: 11/06/20 0916     WBC 8.49 x10 3/uL      Hgb 14.9 g/dL      Hematocrit 40.9 %      Platelets 297 x10 3/uL      RBC 5.29 x10 6/uL      MCV 86.0 fL      MCH 28.2 pg      MCHC 32.7 g/dL  RDW 14 %      MPV 9.0 fL      Neutrophils 62.2 %      Lymphocytes Automated 26.0 %      Monocytes 7.3 %      Eosinophils Automated 3.4 %      Basophils Automated 0.7 %      Immature Granulocytes 0.4 %      Nucleated RBC 0.0 /100 WBC      Neutrophils Absolute 5.28 x10 3/uL      Lymphocytes Absolute Automated 2.21 x10 3/uL      Monocytes Absolute Automated 0.62 x10 3/uL      Eosinophils Absolute Automated 0.29 x10 3/uL      Basophils Absolute Automated 0.06 x10 3/uL      Immature Granulocytes Absolute 0.03 x10 3/uL      Absolute NRBC 0.00 x10 3/uL     Comprehensive metabolic panel [[id number]] Collected: 11/06/20 0859    Specimen: Blood Updated: 11/06/20 0905    PT/APTT [[id number]] Collected: 11/06/20 0859     Updated: 11/06/20 0905    Troponin I [[id number]] Collected: 11/06/20 0859    Specimen: Blood Updated: 11/06/20 0905    Glucose Whole Blood - POCT [[id number]] Collected: 11/06/20 0859     Updated: 11/06/20 0901     Whole Blood Glucose POCT 97 mg/dL             Rads:     Results for orders placed or performed during the hospital encounter of 11/06/20   CT Head without Contrast    Narrative    CT HEAD WO CONTRAST:11/06/2020 8:43 AM     CLINICAL HISTORY: .Headache, intracranial hemorrhage suspected          TECHNIQUE: Multiple 5 mm thickness axial images were obtained from  the  skull base to the vertex without IV contrast administration. Coronal and  sagittal reformatted images were then obtained.    Note that CT scanning at this site utilizes multiple dose reduction  techniques including automatic exposure control, adjustment of the MAS  and/or KVP according to patient size, and use of iterative  reconstruction technique.    FINDINGS: Gray-white matter junction differentiation is appropriate. No  intracranial hemorrhage is identified. The ventricles are appropriate in  size, shape and are midline. There is no intracranial mass, mass-effect  or midline shift. No abnormal intra or extra-axial fluid collections are  identified.     The visualized paranasal sinuses and mastoid air cells are well-aerated.  The osseous calvarium is intact.      Impression    No CT evidence of an acute intracranial abnormality.        No change from CT HEAD WO CONTRAST with report dated 01/24/2018 8:25 PM.    Laurena Slimmer, MD   11/06/2020 8:59 AM   Results for orders placed or performed in visit on 03/09/17   MRI brain without contrast    Narrative    Examination: MRI of the brain.    HISTORY: Previous subarachnoid hemorrhage. Persistent headaches.    TECHNIQUE: Sagittal T1-weighted images. Axial FLAIR images, diffusion  images, T2-weighted images and gradient echo images.    The ventricles and sulci are normal in size. There appears to be  moderate cerebellar atrophy. No intra or extra-axial mass lesion,  hemorrhage or infarction is seen. There are normal flow voids at the  base of the brain. No significant white matter disease is seen.      Impression     Cerebellar atrophy. No  acute intracranial lesion is seen.    Elray Mcgregor, MD   03/09/2017 10:58 AM   Results for orders placed or performed during the hospital encounter of 08/25/16   MRI Brain W WO Contrast    Narrative    HISTORY: 70 years old, small SAH,     6.9 cc Gadavist, subarachnoid  hemorrhage,         TECHNIQUE: Routine MRI brain with and  without contrast.   COMPARISON: CT head dated 08/24/2016.  FINDINGS:  There is again a small focal area of subarachnoid hemorrhage about the  left parietal paramedian region, without appreciable change. Underlying  brain parenchyma appear within normal limit, without evidence of  abnormal enhancement. No evidence of a dural venous sinus thrombosis.     There is incidental cisterna magna of the posterior fossa.  The brain parenchyma is unremarkable for age   .  The diffusion weighted images are normal without acute ischemia.     The ventricles and the basal cisterns are normal without mass effect.     The paranasal sinuses are normal.           Impression       1.  Small focal area of subarachnoid hemorrhage about the left  paramedian parietal  lobe, unchanged.         Einar Pheasant, MD   08/25/2016 8:17 AM           Elspeth Cho  [facility]  Available on Epic chat

## 2020-11-07 LAB — ECG 12-LEAD
Atrial Rate: 63 {beats}/min
IHS MUSE NARRATIVE AND IMPRESSION: NORMAL
P Axis: 6 degrees
P-R Interval: 134 ms
Q-T Interval: 406 ms
QRS Duration: 76 ms
QTC Calculation (Bezet): 415 ms
R Axis: -2 degrees
T Axis: 16 degrees
Ventricular Rate: 63 {beats}/min

## 2021-03-30 ENCOUNTER — Telehealth: Payer: Self-pay | Admitting: Neurology

## 2021-03-30 NOTE — Telephone Encounter (Signed)
Name, strength, directions of requested refill(s):pramipexole (MIRAPEX) 0.25 MG tablet        Pharmacy to send refill to or patient to pick up rx from office (mark requested pharmacy in BOLD):      [facility]  Emergency Department  [address]  [place] [address]  Phone: [phone] Fax: [phone]    [id number] - [place], [place] - [address]  [place] [address]  Phone: [phone] Fax: [phone]        Please mark "X" next to the preferred call back number:    Mobile:   No relevant phone numbers on file.       Home: @HOMEPHONE @    Work: @WORKPHONE @        Medication refill request , see above. Thank you     Additional Notes/How much medication is remaining:     Next Visit:

## 2021-05-17 ENCOUNTER — Telehealth: Payer: Self-pay | Admitting: Neurology

## 2021-05-17 NOTE — Telephone Encounter (Signed)
Name, strength, directions of requested refill(s):      pramipexole (MIRAPEX) 0.25 MG tablet      How much medication is remaining: 0    Pharmacy to send refill to or patient to pick up rx from office (mark requested pharmacy in BOLD):      [facility]  Emergency Department  [address]  [place] [address]  Phone: [phone] Fax: [phone]    [facility] [address] [place], [place] - [address]  [place] [address]  Phone: [phone] Fax: [phone]        Please mark "X" next to the preferred call back number:    Mobile: There is no such number on file (mobile).    Home: [phone] (home)    Work: @WORKPHONE @        Medication refill request, see above. Thank you     Additional Notes: Patient is in [place], has broken her arm and unable to drive back to [place] at this time. Patient is out of medication.     Next Visit: MM/DD/YY

## 2022-02-03 IMAGING — MR MRI BRAIN W/O CONTRAST
7 series · 48 of 48 positions shown · IV contrast (Off)
Comparison: Exam is compared to prior outside CT dated 11/09/2021.

MRI OF THE BRAIN WITHOUT CONTRAST
CLINICAL HISTORY: History of two previous head injuries in July and September 2021 with surgery and burr holes and craniotomy with continued pain.
TECHNIQUE: Multisequential multiplanar imaging was performed of the brain, including diffusion-weighted imaging.

[Series 2: T1 · sagittal · 5.0mm · 0.90mm/px · 8 of 23 slices shown (1 of 2)]
[im 1/23]
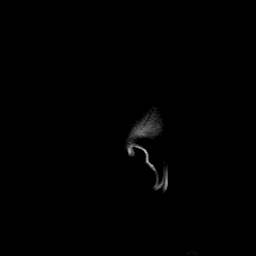
[im 4/23]
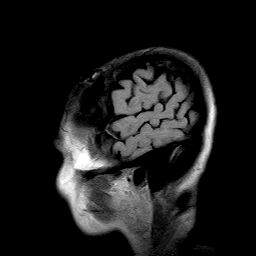
[im 7/23]
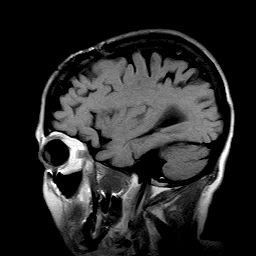
[im 10/23]
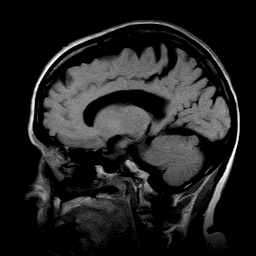
[im 13/23]
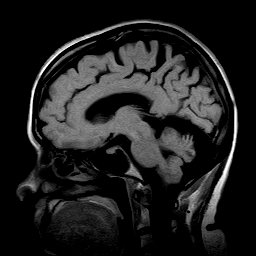
[im 16/23]
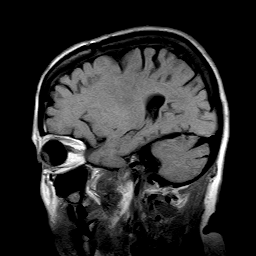
[im 19/23]
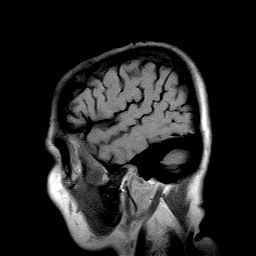
[im 23/23]
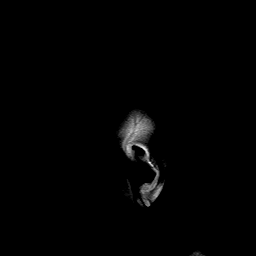

[Series 3: T2 · axial · 5.0mm · 0.94mm/px · z∈[-98,+49]mm · 7 of 23 slices shown]
[im 1/23]
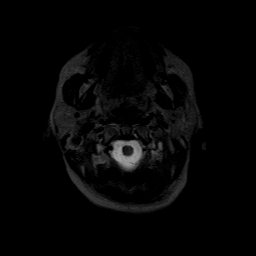
[im 4/23]
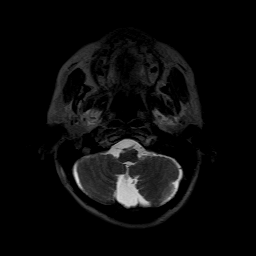
[im 8/23]
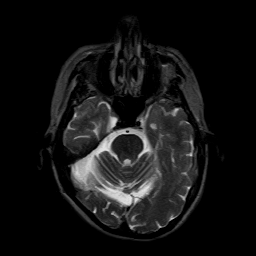
[im 12/23]
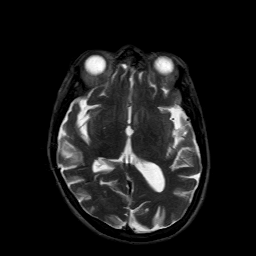
[im 15/23]
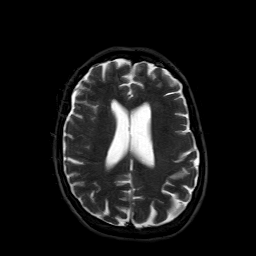
[im 19/23]
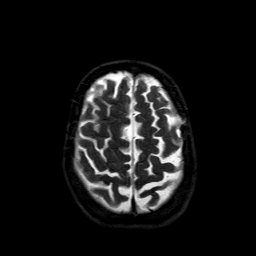
[im 23/23]
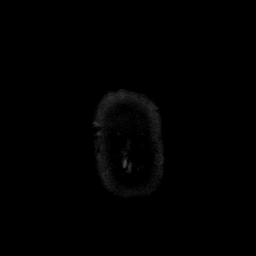

[Series 4: FLAIR · axial · 5.0mm · 0.94mm/px · z∈[-98,+49]mm · 7 of 23 slices shown (1 of 2)]
[im 1/23]
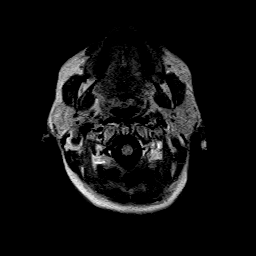
[im 4/23]
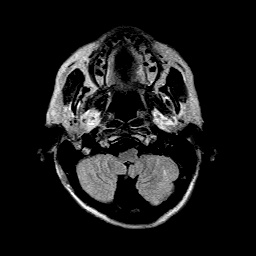
[im 8/23]
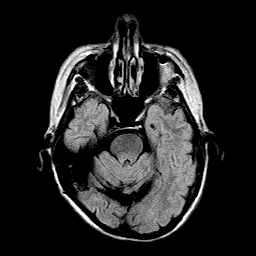
[im 12/23]
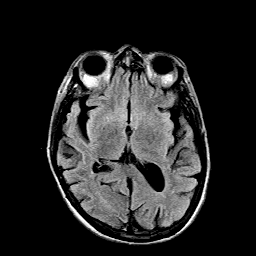
[im 15/23]
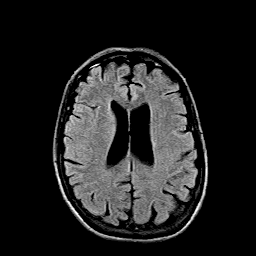
[im 19/23]
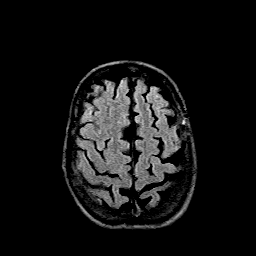
[im 23/23]
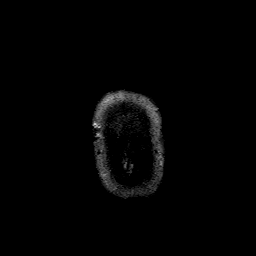

[Series 5: T1 · axial · 5.0mm · 0.94mm/px · z∈[-98,+49]mm · 7 of 23 slices shown (2 of 2)]
[im 1/23]
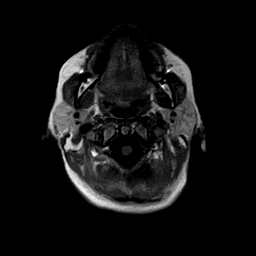
[im 4/23]
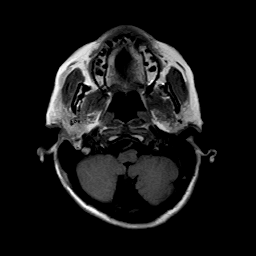
[im 8/23]
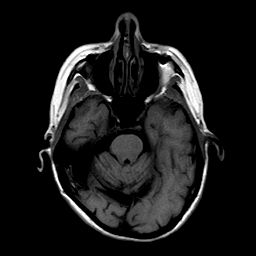
[im 12/23]
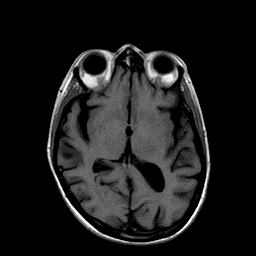
[im 15/23]
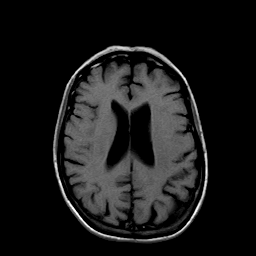
[im 19/23]
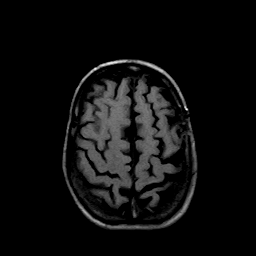
[im 23/23]
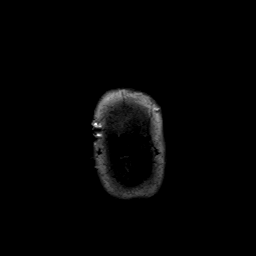

[Series 11: DWI · axial · 6.0mm · 1.88mm/px · z∈[-88,+40]mm · 6 of 18 slices shown]
[im 1/18]
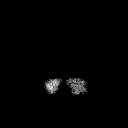
[im 4/18]
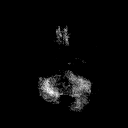
[im 7/18]
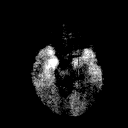
[im 11/18]
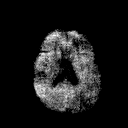
[im 14/18]
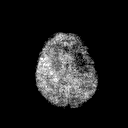
[im 18/18]
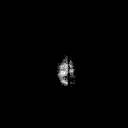

[Series 12: ADC · axial · 6.0mm · 1.88mm/px · z∈[-88,+40]mm · 6 of 18 slices shown]
[im 1/18]
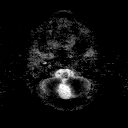
[im 4/18]
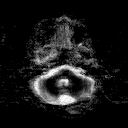
[im 7/18]
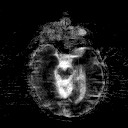
[im 11/18]
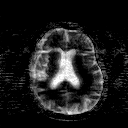
[im 14/18]
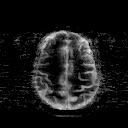
[im 18/18]
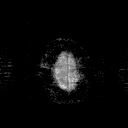

[Series 13: FLAIR · sagittal · 5.0mm · 0.98mm/px · 7 of 23 slices shown (2 of 2)]
[im 1/23]
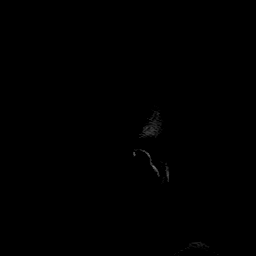
[im 4/23]
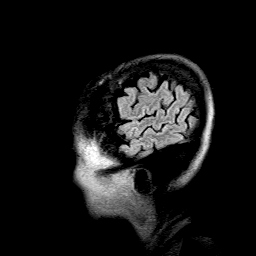
[im 8/23]
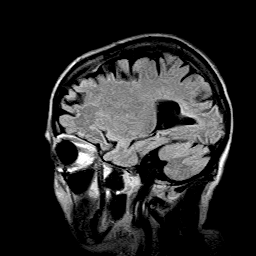
[im 12/23]
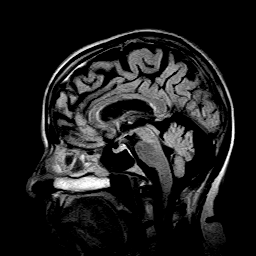
[im 15/23]
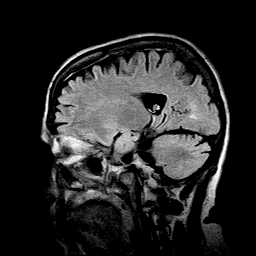
[im 19/23]
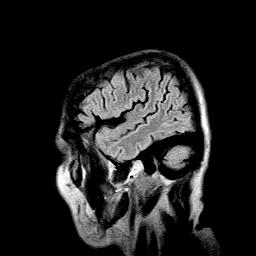
[im 23/23]
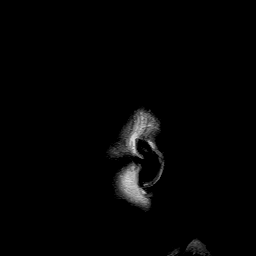

[48 of 48 positions shown; findings below may reference images not displayed]

FINDINGS: There is evidence of previous burr holes on the right side x 2 as well as craniotomy defect in the left high parietal calvarium anteriorly. Involutional change identified with prominence of the ventricles and sulci. There is some periventricular and to a lesser extent deep white matter demyelination present. CT scan appears to demonstrate a subdural hygroma, however, on the MRI scan it appears more likely to be related to involutional change. No evidence of restricted diffusion.
IMPRESSION: 1.
Involutional change with prominence of the ventricles and sulci as well as periventricular and to a lesser extent deep white matter demyelination consistent with small vessel ischemic change and/or ischemic leukomalacia. 

2.
No evidence of acute or subacute ischemia or other cause of restricted diffusion. 

3.
Evidence of previous burr holes on the right and craniotomy on the left, as noted. CT scan suggests the possibility of a subdural hygroma, however, based on the MRI appearance it is more likely this represents prominence of the CSF space related to involutional change.

## 2022-02-16 IMAGING — MR MRI LUMBAR SPINE WITHOUT CONTRAST
4 series · 48 of 48 positions shown · IV contrast (Off)
Comparison: none

MRI OF THE LUMBAR SPINE WITHOUT CONTRAST
CLINICAL HISTORY: Balance and gait problems, right leg pain.
TECHNIQUE: Multisequential multiplanar imaging was performed of the lumbar spinal region.

[Series 1: z s-c scano · coronal · 6.0mm · 1.17mm/px · 5 of 6 slices shown]
[im 1/6]
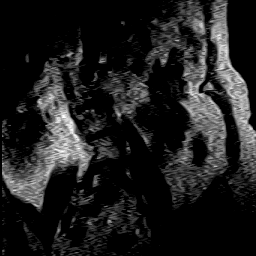
[im 2/6]
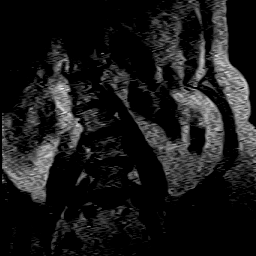
[im 3/6]
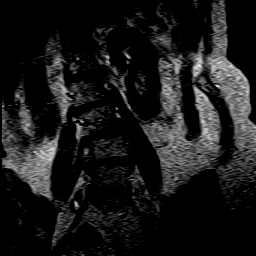
[im 4/6]
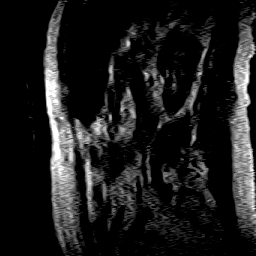
[im 6/6]
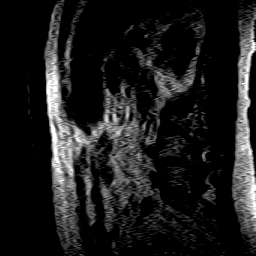

[Series 2: T2 · sagittal · 5.0mm · 1.13mm/px · 11 of 15 slices shown (1 of 2)]
[im 1/15]
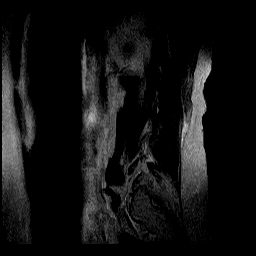
[im 2/15]
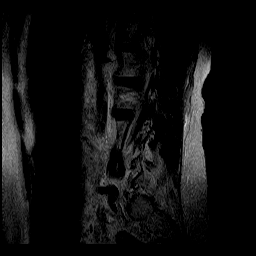
[im 3/15]
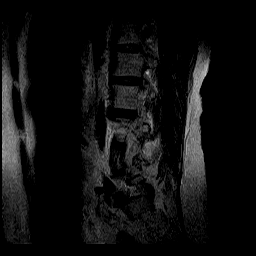
[im 5/15]
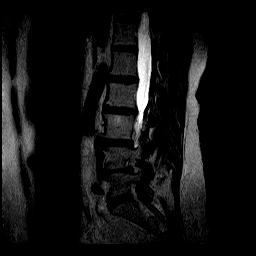
[im 6/15]
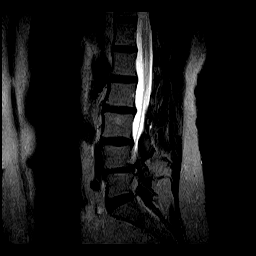
[im 8/15]
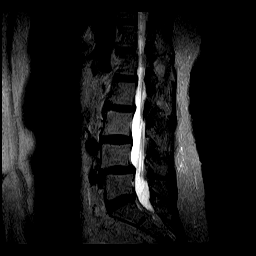
[im 9/15]
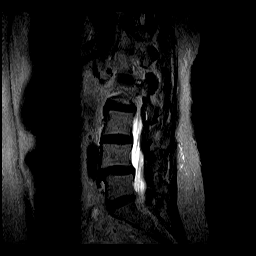
[im 10/15]
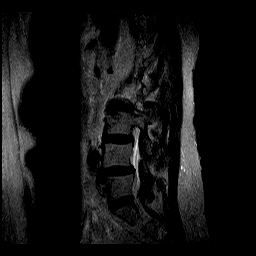
[im 12/15]
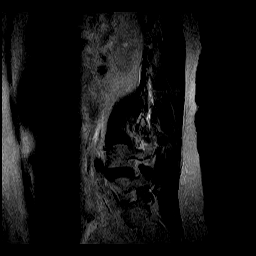
[im 13/15]
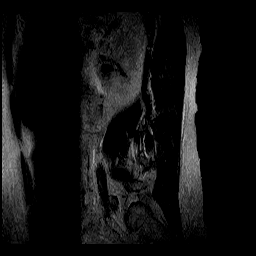
[im 15/15]
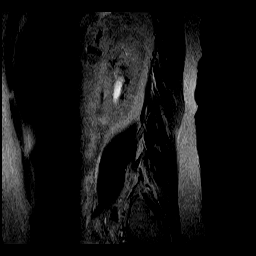

[Series 3: T1 · sagittal · 5.0mm · 1.13mm/px · 11 of 15 slices shown]
[im 1/15]
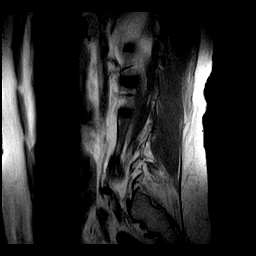
[im 2/15]
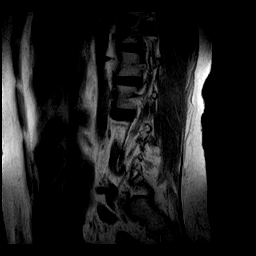
[im 3/15]
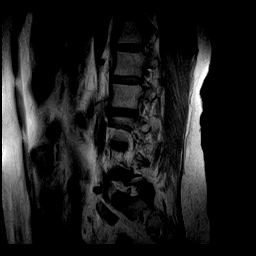
[im 5/15]
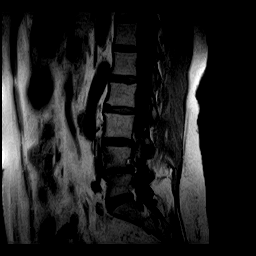
[im 6/15]
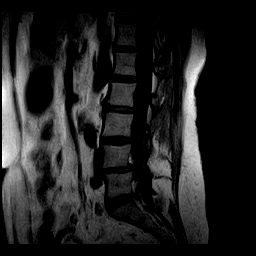
[im 8/15]
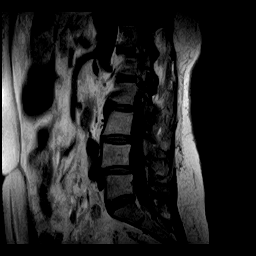
[im 9/15]
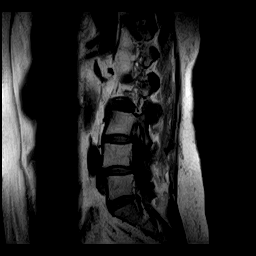
[im 10/15]
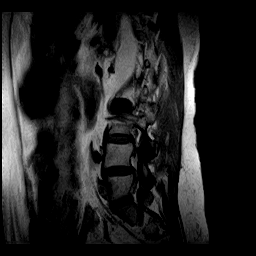
[im 12/15]
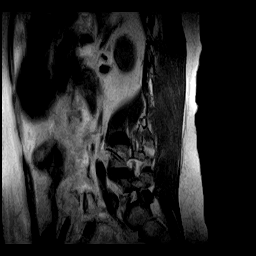
[im 13/15]
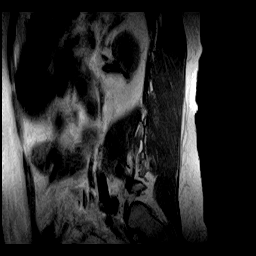
[im 15/15]
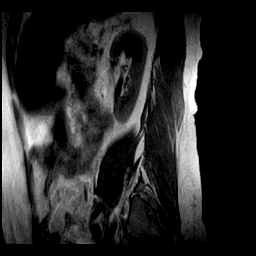

[Series 5: T2 · axial · 4.0mm · 1.17mm/px · z∈[-88,+81]mm · 21 of 27 slices shown (2 of 2)]
[im 1/27]
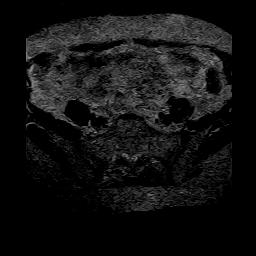
[im 2/27]
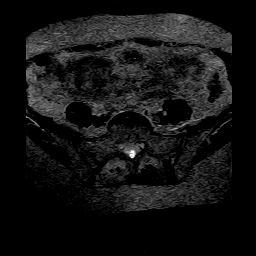
[im 3/27]
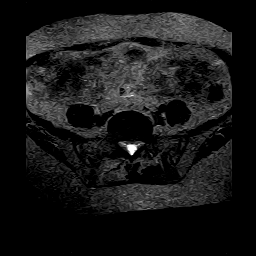
[im 4/27]
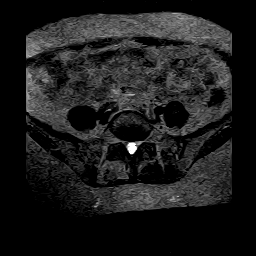
[im 6/27]
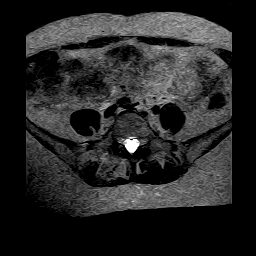
[im 7/27]
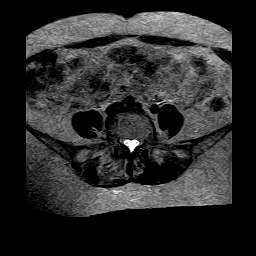
[im 8/27]
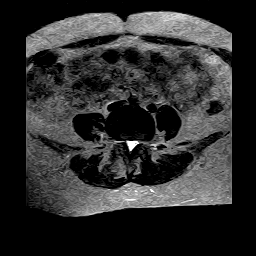
[im 10/27]
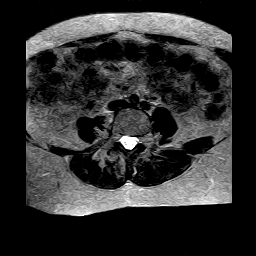
[im 11/27]
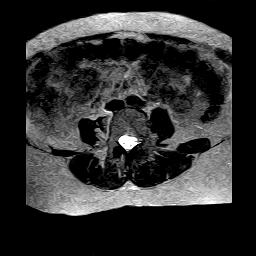
[im 12/27]
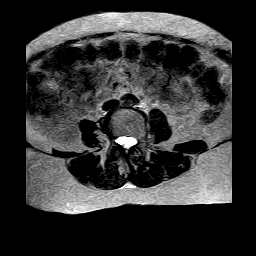
[im 14/27]
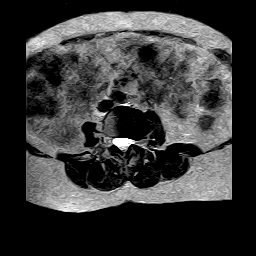
[im 15/27]
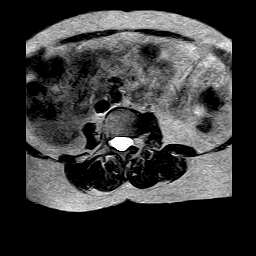
[im 16/27]
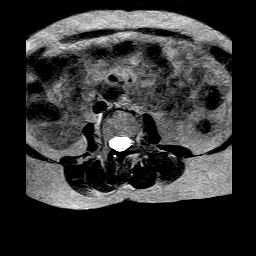
[im 17/27]
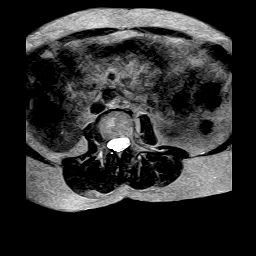
[im 19/27]
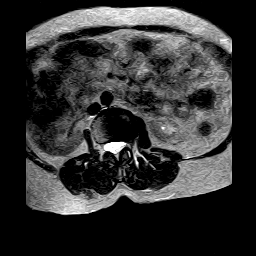
[im 20/27]
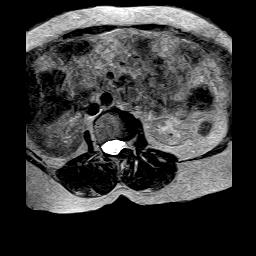
[im 21/27]
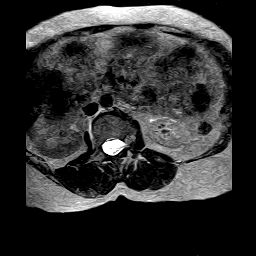
[im 23/27]
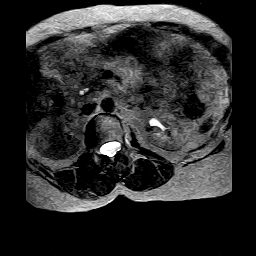
[im 24/27]
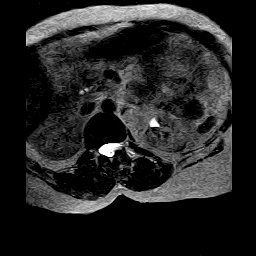
[im 25/27]
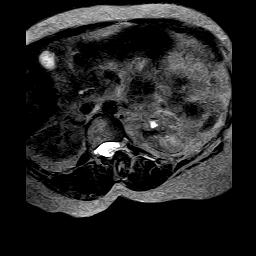
[im 27/27]
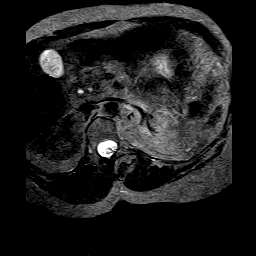

[48 of 48 positions shown; findings below may reference images not displayed]

FINDINGS: There is a normal marrow signal noted throughout the lumbar vertebral bodies. The conus medullaris is unremarkable and there is no obvious intradural abnormality noted. There is moderate to severe facet arthrosis and ligamentum flavum hypertrophy throughout the lumbar spinal region. There is moderate S-shaped scoliosis of the thoracolumbar spine. 

At L1-L2 level, there is no evidence of disc herniation, neural foraminal narrowing, or spinal stenosis. 

At L2-L3 level, there is anterior, posterior, and lateral osteophyte, 1.5 mm anterolisthesis, desiccation, and 1 to 1.5 mm bulging. 

At L3-L4 level, there is 1.5 mm anterolisthesis, desiccation, and 1.5 mm bulging. Moderate bilateral neural foraminal narrowing. 

At L4-L5 level, there is narrowing, anterior, posterior, and lateral osteophyte, 3 to 3.5 mm anterolisthesis, desiccation, and 2 mm bulging. Severe spinal stenosis and severe bilateral neural foraminal narrowing. 

At L5-S1 level, there is desiccation and 1.5 mm bulging.
IMPRESSION: 1.
Severe spinal stenosis at L4-L5 including narrowing, anterior, posterior, and lateral osteophyte, 3 to 3.5 mm anterolisthesis, desiccation, and 2 mm bulging. Anterior-posterior dimension of the spinal canal of approximately 7 to 7.5 mm. Anterior, posterior, and lateral osteophyte, desiccation, and 1 to 1.5 mm bulging at L2-L3 and desiccation and 1.5 mm bulging at L3-L4 and L5-S1. There is moderate to severe facet arthrosis and ligamentum flavum hypertrophy throughout. 

2.
Lateral osteophyte and facet arthrosis, including asymmetric lateral osteophyte and facet arthrosis associated with moderate S-shaped scoliosis of the thoracolumbar spine, are causing moderate bilateral neural foraminal narrowing at L3-L4 and severe bilateral neural foraminal narrowing at L4-L5 level. 

3.
1.5 mm anterolisthesis of L2-L3 and 3 to 3.5 mm anterolisthesis of L4-L5 in neutral position.
